# Patient Record
Sex: Female | Born: 1990 | Race: Black or African American | Hispanic: No | Smoking: Current some day smoker
Health system: Southern US, Community
[De-identification: ages and names within clinical notes are randomized; demographics above are authoritative.]

## PROBLEM LIST (undated history)

## (undated) DIAGNOSIS — Z789 Other specified health status: Secondary | ICD-10-CM

## (undated) HISTORY — DX: Other specified health status: Z78.9

## (undated) HISTORY — PX: NO PAST SURGERIES: SHX2092

---
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
## 2010-07-29 ENCOUNTER — Emergency Department (HOSPITAL_COMMUNITY): Admission: EM | Admit: 2010-07-29 | Discharge: 2010-07-29 | Payer: Self-pay | Admitting: Emergency Medicine

## 2010-12-22 ENCOUNTER — Emergency Department (HOSPITAL_COMMUNITY)
Admission: EM | Admit: 2010-12-22 | Discharge: 2010-12-22 | Disposition: A | Discharge status: Home / Self Care | Payer: Self-pay | Attending: Emergency Medicine | Admitting: Emergency Medicine

## 2010-12-22 DIAGNOSIS — R51 Headache: Secondary | ICD-10-CM | POA: Insufficient documentation

## 2010-12-22 DIAGNOSIS — J019 Acute sinusitis, unspecified: Secondary | ICD-10-CM | POA: Insufficient documentation

## 2010-12-31 LAB — URINALYSIS, ROUTINE W REFLEX MICROSCOPIC
Bilirubin Urine: NEGATIVE
Hgb urine dipstick: NEGATIVE
Ketones, ur: NEGATIVE mg/dL
Protein, ur: NEGATIVE mg/dL
Specific Gravity, Urine: 1.02 (ref 1.005–1.030)
Urobilinogen, UA: 1 mg/dL (ref 0.0–1.0)
pH: 6 (ref 5.0–8.0)

## 2010-12-31 LAB — URINE MICROSCOPIC-ADD ON

## 2011-03-06 NOTE — Op Note (Signed)
Crystal Gould, Crystal Gould               ACCOUNT NO.:  [id number]   MEDICAL RECORD NO.:  [id number]          PATIENT TYPE:  INP   LOCATION:  A401                          FACILITY:  [facility]   PHYSICIAN:  Lazaro Arms, M.D.   DATE OF BIRTH:  06-09-1991   DATE OF PROCEDURE:  12/11/2006  DATE OF DISCHARGE:  12/12/2006                               OPERATIVE REPORT   PROCEDURE:  Delivery note.   INDICATIONS FOR PROCEDURE:  Crystal Gould is a 20 year old gravida 1, para 0,  at [redacted] weeks gestation who came in active labor.  She underwent an  amniotomy and had thick meconium in amniotic fluid.  She has progressed  very quickly through the active phase of labor.  She has had a reactive  NST throughout.   The patient had very little maternal expulsive efforts. About three  contractions, she pushed and over an intact perineum delivered a viable  female infant at 1335, Apgars were 9 and 9, weighing 5 pounds 5 ounces.  There was a three vessel cord.  Cord blood and cord gas were sent.  The  infant underwent DeLee suction for quite some time on the perineum and  got about 6 mL of meconium stained amniotic fluid from the stomach and  nasopharynx.  The infant then cried after delivery and so I went ahead  and suctioned it and did quite well and was quite vigorous.  Cord blood  and cord gas were sent.  The placenta was delivered spontaneously and  was intact.  The uterus was firm.  The infant underwent routine neonatal  resuscitation.  There was a small right periurethral laceration and a  perineal laceration, neither of which required repair.  They were both  first degree.  She will undergo routine postpartum course and blood loss  for delivery is 250 mL.      Lazaro Arms, M.D.  Electronically Signed     LHE/MEDQ  D:  12/12/2006  T:  12/12/2006  Job:  [id number]

## 2011-03-06 NOTE — H&P (Signed)
Crystal Gould, Crystal Gould               ACCOUNT NO.:  [id number]   MEDICAL RECORD NO.:  [id number]          PATIENT TYPE:  INP   LOCATION:  A401                          FACILITY:  [facility]   PHYSICIAN:  Lazaro Arms, M.D.   DATE OF BIRTH:  May 01, 1991   DATE OF ADMISSION:  12/11/2006  DATE OF DISCHARGE:  LH                              HISTORY & PHYSICAL   Crystal Gould is a 20 year old African-American female gravida 1, para 0,  estimated date of delivery of December 23, 2006, currently at [redacted] weeks  gestation who presented to Labor & Delivery in spontaneous labor.  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  She came in and she is 4 cm  dilated, in spontaneous labor, reactive NST.   PAST MEDICAL HISTORY:  Negative.   PAST SURGERY:  Negative.   PAST OB:  She is nulliparous.   ALLERGIES:  NONE.   MEDICATIONS:  Prenatal vitamins with iron.   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  GC and Chlamydia:  GC was negative and Chlamydia has been negative 3  times despite supposedly taking treatment and her social partner being  treated.  She is Group B Strep negative, Glucola was 78.   IMPRESSION:  1. Intrauterine pregnancy [redacted] weeks gestation.  2. Active labor.   PLAN:  Patient admitted for labor management.      Lazaro Arms, M.D.  Electronically Signed     LHE/MEDQ  D:  12/12/2006  T:  12/12/2006  Job:  [id number]

## 2011-03-06 NOTE — H&P (Signed)
NAMEHOLLY, IANNACCONE               ACCOUNT NO.:  [id number]   MEDICAL RECORD NO.:  [id number]          PATIENT TYPE:  OIB   LOCATION:  LDR1                          FACILITY:  [facility]   PHYSICIAN:  Tilda Burrow, M.D. DATE OF BIRTH:  [birth year]   DATE OF ADMISSION:  12/06/2006  DATE OF DISCHARGE:  LH                              HISTORY & PHYSICAL   OBSERVATION ADMITTING HISTORY AND PHYSICAL:  Date of admission December 06, 2006, at 4:32 a.m.   HISTORY OF PRESENT ILLNESS:  Novi is a 20 year old gravida 1, para 0,  in with complaints of uterine contractions every 10 minutes.   MEDICAL HISTORY:  Negative.   SURGICAL HISTORY:  Negative.   FAMILY HISTORY:  Positive for hypertension and cancer.   Prenatal course uneventful with the exception that she has chronic  Chlamydia, she is noncompliant with her medication.  Apparently when she  takes it she throws it right back up, so when she gets in labor we will  treat her with IV azithromycin also.  She was seen in the office last  week and noted to have a cellulitis of the right breast, for which she  was placed on Keflex and states she is taking that at present time.   PHYSICAL EXAMINATION:  VITAL SIGNS:  Stable.  PELVIC:  Cervix is about 2 cm, 70% effaced, -1 to -2 station, which is  unchanged since her admit exam.   PLAN:  We are going to discharge her home with therapeutic rest, give  her Rocephin 1 g IM for a UTI and the cellulitis, and she is to follow  up this week in the office or p.r.n. as needed.      Zerita Boers, Lanier Clam      Tilda Burrow, M.D.  Electronically Signed    DL/MEDQ  D:  21/30/8657  T:  12/06/2006  Job:  [id number]   cc:   [facility]

## 2011-03-06 NOTE — H&P (Signed)
Crystal Gould, Crystal Gould               ACCOUNT NO.:  [id number]   MEDICAL RECORD NO.:  [id number]          PATIENT TYPE:  OIB   LOCATION:  LDR4                          FACILITY:  [facility]   PHYSICIAN:  Tilda Burrow, M.D. DATE OF BIRTH:  1991/06/16   DATE OF ADMISSION:  12/08/2006  DATE OF DISCHARGE:  LH                              HISTORY & PHYSICAL   REASON FOR VISIT:  Prenatal care and she has a right breast abscess that  is unresponsive to p.o. antibiotics.   MEDICAL HISTORY:  Negative.   SURGICAL HISTORY:  Negative.   FAMILY HISTORY:  Positive for hypertension and cancer.   PRENATAL COURSE:  Uneventful with the exception she has had several  positive Chlamydias and HSV-2.  Blood type is O positive.  UDS negative.  Rubella is immune.  Hepatitis B surface antigen is negative.  HIV is  negative.  HSV-2 biopsy of Pap normal.  Serology nonreactive.  GC has  been negative on first draw; second culture she is positive with both GC  and Chlamydia and she is unable to keep down her p.o. medications for  the treatment so we are going to treat her with IV antibiotics.  AFP  normal.  A 28-week hemoglobin is 12.2.  A 28-week hematocrit 31.  One-  hour glucose is 78.   PHYSICAL EXAMINATION:  On physical exam today, weight is 126, blood  pressure 120/80.  There is 1+ leukocytes.  Fundal height is 36 cm.  Fetal heart is 130, strong and regular.  On her right breast, the  abscess is  significantly larger.  When I first saw her last week, I thought it was  a mild cellulitis.  I put her on Keflex.  I also saw her in the hospital  and gave her some IM Rocephin and it is unresponsive and now there is a  definite abscess present.  I consulted with Dr. Emelda Fear. We are going  to get Dr. Lovell Sheehan to evaluate the patient for possible I&D.      Zerita Boers, Lanier Clam      Tilda Burrow, M.D.  Electronically Signed    DL/MEDQ  D:  16/07/9603  T:  12/08/2006  Job:  [id number]   cc:   Dalia Heading, M.D.  Fax: [phone]   [facility]

## 2011-03-06 NOTE — Op Note (Signed)
NAMEATALYA, DANO               ACCOUNT NO.:  [id number]   MEDICAL RECORD NO.:  [id number]          PATIENT TYPE:  OIB   LOCATION:  LDR4                          FACILITY:  [facility]   PHYSICIAN:  Dalia Heading, M.D.  DATE OF BIRTH:  June 22, 1991   DATE OF PROCEDURE:  12/08/2006  DATE OF DISCHARGE:                               OPERATIVE REPORT   PREOPERATIVE DIAGNOSIS:  Abscess, right breast.   POSTOPERATIVE DIAGNOSIS:  Abscess, right breast.   PROCEDURE:  Incision and drainage of right breast abscess.   SURGEON:  Dr. Franky Macho   ANESTHESIA:  Local   INDICATIONS:  The patient is a 20 year old 8-1/2 month pregnant black  female who presents with a worsening abscess along the inferior aspect  of the areolar complex in the right breast.  The patient comes to the  minor procedure room for incision and drainage of the abscess.  Risks  and benefits of procedure were fully explained to the patient's mother,  who gave informed consent for the patient as the patient is a minor.   PROCEDURE NOTE:  The patient was placed in supine position.  Surgical  site confirmation was performed.  The right breast was prepped and  draped in the usual sterile technique with Betadine.  5 mL of 1%  Xylocaine was used local anesthesia.   An incision was made into the fluctuant an area along the infra-areolar  border.  Purulent fluid was obtained.  Cultures were sent to  microbiology for further examination and treatment.  The wound was  irrigated with saline.  Skin was then packed with iodoform Nugauze.  Dry  sterile dressing was then applied.   All tape and needle counts correct at end of the procedure.  The patient  tolerated procedure well and was transferred back to the 4th floor in  stable condition.   COMPLICATIONS:  None.   SPECIMEN:  Routine culture of right breast abscess.   BLOOD LOSS:  Minimal.      Dalia Heading, M.D.  Electronically Signed     MAJ/MEDQ  D:  12/08/2006  T:   12/08/2006  Job:  [id number]   cc:   Tilda Burrow, M.D.  Fax: [phone]

## 2011-07-09 LAB — URINALYSIS, ROUTINE W REFLEX MICROSCOPIC
Glucose, UA: NEGATIVE
Nitrite: NEGATIVE
Specific Gravity, Urine: <1.005 — ABNORMAL LOW
Urobilinogen, UA: 0.2
pH: 7

## 2011-07-09 LAB — URINE MICROSCOPIC-ADD ON

## 2011-07-10 LAB — CBC
HCT: 24.8 — ABNORMAL LOW
Hemoglobin: 8.7 — ABNORMAL LOW
MCV: 73.2 — ABNORMAL LOW
MCV: 73.3 — ABNORMAL LOW
Platelets: 188
RBC: 3.39 — ABNORMAL LOW
RBC: 3.5 — ABNORMAL LOW
RDW: 14.4

## 2011-07-10 LAB — RPR: RPR Ser Ql: NONREACTIVE

## 2011-08-05 ENCOUNTER — Emergency Department (HOSPITAL_COMMUNITY)
Admission: EM | Admit: 2011-08-05 | Discharge: 2011-08-05 | Disposition: A | Discharge status: Home / Self Care | Payer: Medicaid Other | Attending: Emergency Medicine | Admitting: Emergency Medicine

## 2011-08-05 DIAGNOSIS — B9689 Other specified bacterial agents as the cause of diseases classified elsewhere: Secondary | ICD-10-CM | POA: Insufficient documentation

## 2011-08-05 DIAGNOSIS — A499 Bacterial infection, unspecified: Secondary | ICD-10-CM | POA: Insufficient documentation

## 2011-08-05 DIAGNOSIS — A749 Chlamydial infection, unspecified: Secondary | ICD-10-CM | POA: Insufficient documentation

## 2011-08-05 DIAGNOSIS — N760 Acute vaginitis: Secondary | ICD-10-CM

## 2011-08-05 DIAGNOSIS — Z331 Pregnant state, incidental: Secondary | ICD-10-CM | POA: Insufficient documentation

## 2011-08-05 LAB — DIFFERENTIAL
Basophils Absolute: 0 10*3/uL (ref 0.0–0.1)
Lymphocytes Relative: 19 % (ref 12–46)
Neutro Abs: 6.3 10*3/uL (ref 1.7–7.7)
Neutrophils Relative %: 75 % (ref 43–77)

## 2011-08-05 LAB — CBC
MCHC: 34.2 g/dL (ref 30.0–36.0)
MCV: 79.5 fL (ref 78.0–100.0)
Platelets: 239 10*3/uL (ref 150–400)
RBC: 4.05 MIL/uL (ref 3.87–5.11)
RDW: 12.8 % (ref 11.5–15.5)

## 2011-08-05 LAB — RPR: RPR Ser Ql: NONREACTIVE

## 2011-08-05 LAB — URINALYSIS, ROUTINE W REFLEX MICROSCOPIC
Glucose, UA: NEGATIVE mg/dL
Leukocytes, UA: NEGATIVE
Nitrite: NEGATIVE
Specific Gravity, Urine: 1.025 (ref 1.005–1.030)
pH: 7 (ref 5.0–8.0)

## 2011-08-05 LAB — BASIC METABOLIC PANEL
BUN: 7 mg/dL (ref 6–23)
Chloride: 103 mEq/L (ref 96–112)
Creatinine, Ser: 0.55 mg/dL (ref 0.50–1.10)
GFR calc Af Amer: >90 mL/min (ref >90)

## 2011-08-05 LAB — PREGNANCY, URINE: Preg Test, Ur: POSITIVE

## 2011-08-05 LAB — URINE MICROSCOPIC-ADD ON

## 2011-08-05 LAB — WET PREP, GENITAL

## 2011-08-05 MED ORDER — METRONIDAZOLE 0.75 % VA GEL: 1.0000 | Freq: Two times a day (BID) | VAGINAL | Status: AC

## 2011-08-05 NOTE — ED Notes (Signed)
Pt reports is approx 2months pregnant.  Pt c/o lower abd pain x 1 week.  Reports weakness and fever over the weekend.  Says fever and weakness are better but started spotting yesterday.  Still having abd pain.  LMP was July.

## 2011-08-05 NOTE — ED Provider Notes (Signed)
History     CSN: [id number] Arrival date & time: 08/05/2011  8:29 AM   First MD Initiated Contact with Patient 08/05/11 [phone]      Chief Complaint  Patient presents with  . Abdominal Pain    (Consider location/radiation/quality/duration/timing/severity/associated sxs/prior treatment) HPI Comments: Pt reports being 2 months pregnant  Patient is a 20 y.o. female presenting with abdominal pain. The history is provided by the patient.  Abdominal Pain The primary symptoms of the illness include abdominal pain and vaginal bleeding. The primary symptoms of the illness do not include shortness of breath or dysuria. The current episode started more than 2 days ago. The onset of the illness was gradual. The problem has been gradually worsening.  The patient states that she believes she is currently pregnant. The patient has not had a change in bowel habit. Additional symptoms associated with the illness include chills. Symptoms associated with the illness do not include heartburn, constipation, urgency, hematuria, frequency or back pain. Significant associated medical issues do not include PUD, GERD, inflammatory bowel disease, diabetes, sickle cell disease, gallstones, liver disease, substance abuse, diverticulitis, HIV or cardiac disease.    History reviewed. No pertinent past medical history.  History reviewed. No pertinent past surgical history.  History reviewed. No pertinent family history.  History  Substance Use Topics  . Smoking status: Former Games developer  . Smokeless tobacco: Not on file  . Alcohol Use: No    OB History    Grav Para Term Preterm Abortions TAB SAB Ect Mult Living   1               Review of Systems  Constitutional: Positive for chills. Negative for activity change.       All ROS Neg except as noted in HPI  HENT: Negative for nosebleeds and neck pain.   Eyes: Negative for photophobia and discharge.  Respiratory: Negative for cough, shortness of breath and  wheezing.   Cardiovascular: Negative for chest pain and palpitations.  Gastrointestinal: Positive for abdominal pain. Negative for heartburn, constipation and blood in stool.  Genitourinary: Positive for vaginal bleeding. Negative for dysuria, urgency, frequency and hematuria.  Musculoskeletal: Negative for back pain and arthralgias.  Skin: Negative.   Neurological: Negative for dizziness, seizures and speech difficulty.  Psychiatric/Behavioral: Negative for hallucinations and confusion.    Allergies  Review of patient's allergies indicates no known allergies.  Home Medications  No current outpatient prescriptions on file.  BP 116/67  Pulse 86  Temp(Src) 98.9 F (37.2 C) (Oral)  Resp 17  Ht 5\' 1"  (1.549 m)  Wt 123 lb (55.792 kg)  BMI 23.24 kg/m2  SpO2 100%  LMP 05/13/2011  Physical Exam  Nursing note and vitals reviewed. Constitutional: She is oriented to person, place, and time. She appears well-developed and well-nourished.  Non-toxic appearance.  HENT:  Head: Normocephalic.  Right Ear: Tympanic membrane and external ear normal.  Left Ear: Tympanic membrane and external ear normal.  Eyes: EOM and lids are normal. Pupils are equal, round, and reactive to light.  Neck: Normal range of motion. Neck supple. Carotid bruit is not present.  Cardiovascular: Normal rate, regular rhythm, normal heart sounds, intact distal pulses and normal pulses.   Pulmonary/Chest: Breath sounds normal. No respiratory distress.  Abdominal: Soft. Bowel sounds are normal. There is tenderness. There is no guarding.       Pt has diffuse mild  tenderness of the abd.. Lower abd more than upper abd. Bowel sound active.  Genitourinary:  The external vaginal area is wnl. The os is closed. Thick white discharge present with small amount of blood in the vaginal vault. Cervix is friable. Mild cervical tenderness. Mild adnexal tenderness. No mass.  Musculoskeletal: Normal range of motion.    Lymphadenopathy:       Head (right side): No submandibular adenopathy present.       Head (left side): No submandibular adenopathy present.    She has no cervical adenopathy.  Neurological: She is alert and oriented to person, place, and time. She has normal strength. No cranial nerve deficit or sensory deficit.  Skin: Skin is warm and dry.  Psychiatric: She has a normal mood and affect. Her speech is normal.    ED Course  Procedures (including critical care time)  Labs Reviewed - No data to display No results found.   No diagnosis found.    MDM  I have reviewed nursing notes, vital signs, and all appropriate lab and imaging results for this patient.  Results for orders placed during the hospital encounter of 08/05/11  CBC      Component Value Range   WBC 8.4  4.0 - 10.5 (K/uL)   RBC 4.05  3.87 - 5.11 (MIL/uL)   Hemoglobin 11.0 (*) 12.0 - 15.0 (g/dL)   HCT 40.9 (*) 81.1 - 46.0 (%)   MCV 79.5  78.0 - 100.0 (fL)   MCH 27.2  26.0 - 34.0 (pg)   MCHC 34.2  30.0 - 36.0 (g/dL)   RDW 91.4  78.2 - 95.6 (%)   Platelets 239  150 - 400 (K/uL)  DIFFERENTIAL      Component Value Range   Neutrophils Relative 75  43 - 77 (%)   Neutro Abs 6.3  1.7 - 7.7 (K/uL)   Lymphocytes Relative 19  12 - 46 (%)   Lymphs Abs 1.6  0.7 - 4.0 (K/uL)   Monocytes Relative 5  3 - 12 (%)   Monocytes Absolute 0.4  0.1 - 1.0 (K/uL)   Eosinophils Relative 1  0 - 5 (%)   Eosinophils Absolute 0.1  0.0 - 0.7 (K/uL)   Basophils Relative 0  0 - 1 (%)   Basophils Absolute 0.0  0.0 - 0.1 (K/uL)  BASIC METABOLIC PANEL      Component Value Range   Sodium 137  135 - 145 (mEq/L)   Potassium 3.8  3.5 - 5.1 (mEq/L)   Chloride 103  96 - 112 (mEq/L)   CO2 25  19 - 32 (mEq/L)   Glucose, Bld 84  70 - 99 (mg/dL)   BUN 7  6 - 23 (mg/dL)   Creatinine, Ser 2.13  0.50 - 1.10 (mg/dL)   Calcium 9.4  8.4 - 08.6 (mg/dL)   GFR calc non Af Amer >90  >90 (mL/min)   GFR calc Af Amer >90  >90 (mL/min)  URINALYSIS, ROUTINE W  REFLEX MICROSCOPIC      Component Value Range   Color, Urine YELLOW  YELLOW    Appearance CLEAR  CLEAR    Specific Gravity, Urine 1.025  1.005 - 1.030    pH 7.0  5.0 - 8.0    Glucose, UA NEGATIVE  NEGATIVE (mg/dL)   Hgb urine dipstick SMALL (*) NEGATIVE    Bilirubin Urine NEGATIVE  NEGATIVE    Ketones, ur NEGATIVE  NEGATIVE (mg/dL)   Protein, ur NEGATIVE  NEGATIVE (mg/dL)   Urobilinogen, UA 1.0  0.0 - 1.0 (mg/dL)   Nitrite NEGATIVE  NEGATIVE    Leukocytes,  UA NEGATIVE  NEGATIVE   PREGNANCY, URINE      Component Value Range   Preg Test, Ur POSITIVE    HCG, QUANTITATIVE, PREGNANCY      Component Value Range   hCG, Beta Chain, Quant, S [id number] (*) <5 (mIU/mL)  GC/CHLAMYDIA PROBE AMP, GENITAL      Component Value Range   GC Probe Amp, Genital NEGATIVE  NEGATIVE    Chlamydia, DNA Probe POSITIVE (*) NEGATIVE   RPR      Component Value Range   RPR NON REACTIVE  NON REACTIVE   WET PREP, GENITAL      Component Value Range   Yeast, Wet Prep NONE SEEN  NONE SEEN    Trich, Wet Prep NONE SEEN  NONE SEEN    Clue Cells, Wet Prep MANY (*) NONE SEEN    WBC, Wet Prep HPF POC TOO NUMEROUS TO COUNT (*) NONE SEEN   URINE MICROSCOPIC-ADD ON      Component Value Range   Squamous Epithelial / LPF MANY (*) RARE    WBC, UA 0-2  <3 (WBC/hpf)   RBC / HPF 0-2  <3 (RBC/hpf)   Bacteria, UA FEW (*) RARE    No results found.       Kathie Dike, [place] 08/20/11 2122

## 2011-08-07 LAB — GC/CHLAMYDIA PROBE AMP, GENITAL
Chlamydia, DNA Probe: POSITIVE — AB
GC Probe Amp, Genital: NEGATIVE

## 2011-08-08 NOTE — ED Notes (Signed)
+   chlamydia Chart sent to EDP office for review. 

## 2011-08-11 NOTE — ED Provider Notes (Signed)
Medical screening examination/treatment/procedure(s) were performed by non-physician practitioner and as supervising physician I was immediately available for consultation/collaboration.  Nicholes Stairs, MD 08/11/11 [phone]

## 2011-08-21 NOTE — ED Provider Notes (Signed)
Medical screening examination/treatment/procedure(s) were performed by non-physician practitioner and as supervising physician I was immediately available for consultation/collaboration.  Elhadj Girton P Antoninette Lerner, MD 08/21/11 0729 

## 2012-07-31 ENCOUNTER — Encounter (HOSPITAL_COMMUNITY): Payer: Self-pay | Admitting: Emergency Medicine

## 2012-07-31 ENCOUNTER — Emergency Department (HOSPITAL_COMMUNITY)
Admission: EM | Admit: 2012-07-31 | Discharge: 2012-07-31 | Disposition: A | Discharge status: Home / Self Care | Payer: Medicaid Other | Attending: Emergency Medicine | Admitting: Emergency Medicine

## 2012-07-31 DIAGNOSIS — R51 Headache: Secondary | ICD-10-CM | POA: Insufficient documentation

## 2012-07-31 DIAGNOSIS — Z87891 Personal history of nicotine dependence: Secondary | ICD-10-CM | POA: Insufficient documentation

## 2012-07-31 DIAGNOSIS — J029 Acute pharyngitis, unspecified: Secondary | ICD-10-CM | POA: Insufficient documentation

## 2012-07-31 NOTE — ED Provider Notes (Signed)
History     CSN: [id number]  Arrival date & time 07/31/12  4098   First MD Initiated Contact with Patient 07/31/12 [phone]      Chief Complaint  Patient presents with  . Headache  . Sore Throat    (Consider location/radiation/quality/duration/timing/severity/associated sxs/prior treatment) HPI Comments: Took a tylenol yest.    No n/v/d.  No PCP.  Patient is a 21 y.o. female presenting with headaches and pharyngitis. The history is provided by the patient. No language interpreter was used.  Headache  This is a new problem. Episode onset: 5 days ago. The problem occurs constantly. The problem has not changed since onset.Associated with: sore throat, subj fever, chills and myalgias. Pain location: diffuse. The quality of the pain is described as throbbing. The pain is moderate. The pain does not radiate. Associated symptoms include a fever. Pertinent negatives include no near-syncope, no shortness of breath, no nausea and no vomiting.  Sore Throat Associated symptoms include chills, a fever, headaches, myalgias and a sore throat. Pertinent negatives include no coughing, nausea or vomiting.    History reviewed. No pertinent past medical history.  History reviewed. No pertinent past surgical history.  No family history on file.  History  Substance Use Topics  . Smoking status: Former Games developer  . Smokeless tobacco: Not on file  . Alcohol Use: No    OB History    Grav Para Term Preterm Abortions TAB SAB Ect Mult Living   1               Review of Systems  Constitutional: Positive for fever and chills.  HENT: Positive for sore throat.   Respiratory: Negative for cough and shortness of breath.   Cardiovascular: Negative for near-syncope.  Gastrointestinal: Negative for nausea and vomiting.  Musculoskeletal: Positive for myalgias.  Neurological: Positive for headaches.  All other systems reviewed and are negative.    Allergies  Review of patient's allergies indicates no  known allergies.  Home Medications  No current outpatient prescriptions on file.  BP 142/91  Pulse 91  Temp 98.3 F (36.8 C) (Oral)  Resp 16  Ht 5\' 1"  (1.549 m)  Wt 110 lb (49.896 kg)  BMI 20.78 kg/m2  SpO2 100%  LMP 07/31/2012  Breastfeeding? Unknown  Physical Exam  Nursing note and vitals reviewed. Constitutional: She is oriented to person, place, and time. She appears well-developed and well-nourished. No distress.  HENT:  Head: Normocephalic and atraumatic.  Mouth/Throat: Uvula is midline and mucous membranes are normal. No uvula swelling. Posterior oropharyngeal erythema present. No oropharyngeal exudate, posterior oropharyngeal edema or tonsillar abscesses.  Eyes: EOM are normal.  Neck: Normal range of motion.  Cardiovascular: Normal rate, regular rhythm and normal heart sounds.   Pulmonary/Chest: Effort normal and breath sounds normal.  Abdominal: Soft. She exhibits no distension. There is no tenderness.  Musculoskeletal: Normal range of motion.  Lymphadenopathy:       Right cervical: No superficial cervical and no deep cervical adenopathy present.      Left cervical: No superficial cervical and no deep cervical adenopathy present.  Neurological: She is alert and oriented to person, place, and time.  Skin: Skin is warm and dry.  Psychiatric: She has a normal mood and affect. Judgment normal.    ED Course  Procedures (including critical care time)   Labs Reviewed  RAPID STREP SCREEN   No results found.   1. Pharyngitis       MDM  Salt water gargles , chloraseptic, tylenol  or ibuprofen.   Find a PCP        Evalina Field, PA 07/31/12 1229

## 2012-07-31 NOTE — ED Notes (Signed)
Vitals done at 1056 are on wrong patient

## 2012-07-31 NOTE — ED Notes (Signed)
Pt c/o ha/sore throat/chills x 5 days.

## 2012-08-02 NOTE — ED Provider Notes (Signed)
Medical screening examination/treatment/procedure(s) were performed by non-physician practitioner and as supervising physician I was immediately available for consultation/collaboration.   Laray Anger, DO 08/02/12 2100

## 2013-04-17 ENCOUNTER — Encounter (HOSPITAL_COMMUNITY): Payer: Self-pay | Admitting: *Deleted

## 2013-04-17 ENCOUNTER — Emergency Department (HOSPITAL_COMMUNITY)
Admission: EM | Admit: 2013-04-17 | Discharge: 2013-04-17 | Disposition: A | Discharge status: Home / Self Care | Payer: Medicaid Other | Attending: Emergency Medicine | Admitting: Emergency Medicine

## 2013-04-17 DIAGNOSIS — F17200 Nicotine dependence, unspecified, uncomplicated: Secondary | ICD-10-CM | POA: Insufficient documentation

## 2013-04-17 DIAGNOSIS — N949 Unspecified condition associated with female genital organs and menstrual cycle: Secondary | ICD-10-CM | POA: Insufficient documentation

## 2013-04-17 DIAGNOSIS — A499 Bacterial infection, unspecified: Secondary | ICD-10-CM | POA: Insufficient documentation

## 2013-04-17 DIAGNOSIS — Z3202 Encounter for pregnancy test, result negative: Secondary | ICD-10-CM | POA: Insufficient documentation

## 2013-04-17 DIAGNOSIS — B9689 Other specified bacterial agents as the cause of diseases classified elsewhere: Secondary | ICD-10-CM

## 2013-04-17 DIAGNOSIS — N898 Other specified noninflammatory disorders of vagina: Secondary | ICD-10-CM | POA: Insufficient documentation

## 2013-04-17 DIAGNOSIS — N390 Urinary tract infection, site not specified: Secondary | ICD-10-CM

## 2013-04-17 DIAGNOSIS — N760 Acute vaginitis: Secondary | ICD-10-CM | POA: Insufficient documentation

## 2013-04-17 LAB — URINALYSIS, ROUTINE W REFLEX MICROSCOPIC
Glucose, UA: NEGATIVE mg/dL
Hgb urine dipstick: NEGATIVE
Ketones, ur: NEGATIVE mg/dL
Protein, ur: NEGATIVE mg/dL
Urobilinogen, UA: 0.2 mg/dL (ref 0.0–1.0)

## 2013-04-17 LAB — CBC WITH DIFFERENTIAL/PLATELET
Basophils Relative: 0 % (ref 0–1)
Eosinophils Absolute: 0.1 10*3/uL (ref 0.0–0.7)
Eosinophils Relative: 2 % (ref 0–5)
HCT: 40.9 % (ref 36.0–46.0)
Hemoglobin: 13.3 g/dL (ref 12.0–15.0)
MCH: 27.2 pg (ref 26.0–34.0)
MCHC: 32.5 g/dL (ref 30.0–36.0)
Monocytes Absolute: 0.3 10*3/uL (ref 0.1–1.0)
Monocytes Relative: 4 % (ref 3–12)
RDW: 13.3 % (ref 11.5–15.5)

## 2013-04-17 LAB — BASIC METABOLIC PANEL
BUN: 13 mg/dL (ref 6–23)
Calcium: 9.8 mg/dL (ref 8.4–10.5)
Chloride: 99 mEq/L (ref 96–112)
Creatinine, Ser: 0.74 mg/dL (ref 0.50–1.10)
GFR calc Af Amer: >90 mL/min (ref >90)
GFR calc non Af Amer: >90 mL/min (ref >90)

## 2013-04-17 LAB — URINE MICROSCOPIC-ADD ON

## 2013-04-17 LAB — WET PREP, GENITAL

## 2013-04-17 MED ORDER — CEPHALEXIN 500 MG PO CAPS: 500.0000 mg | ORAL_CAPSULE | Freq: Four times a day (QID) | ORAL | Status: DC

## 2013-04-17 MED ORDER — METRONIDAZOLE 500 MG PO TABS: 500.0000 mg | ORAL_TABLET | Freq: Two times a day (BID) | ORAL | Status: DC

## 2013-04-17 NOTE — ED Provider Notes (Signed)
History    CSN: [id number] Arrival date & time 04/17/13  0829  First MD Initiated Contact with Patient 04/17/13 0845     Chief Complaint  Patient presents with  . Vaginal Bleeding   (Consider location/radiation/quality/duration/timing/severity/associated sxs/prior Treatment) Patient is a 22 y.o. female presenting with vaginal bleeding. The history is provided by the patient.  Vaginal Bleeding Quality:  Spotting Severity:  Mild Onset quality:  Sudden Duration:  2 days Timing:  Constant Progression:  Partially resolved Chronicity:  New Menstrual history:  Regular Number of pads used:  One per day Possible pregnancy: unknown.   Context: spontaneously   Relieved by:  Nothing Worsened by:  Nothing tried Ineffective treatments:  None tried Associated symptoms: vaginal discharge   Associated symptoms: no abdominal pain, no back pain, no dizziness, no dysuria, no fatigue, no fever and no nausea   Vaginal discharge:    Quality:  White and thin   Severity:  Mild   Onset quality:  Gradual   Timing:  Constant   Progression:  Unchanged   Chronicity:  New Risk factors: unprotected sex   Risk factors: no hx of ectopic pregnancy, no gynecological surgery, does not have multiple partners, no PID and no terminated pregnancies    History reviewed. No pertinent past medical history. History reviewed. No pertinent past surgical history. No family history on file. History  Substance Use Topics  . Smoking status: Current Every Day Smoker    Types: Cigarettes  . Smokeless tobacco: Not on file  . Alcohol Use: Yes     Comment: sometimes   OB History   Grav Para Term Preterm Abortions TAB SAB Ect Mult Living   1              Review of Systems  Constitutional: Negative for fever, chills, appetite change and fatigue.  Respiratory: Negative for shortness of breath.   Cardiovascular: Negative for chest pain.  Gastrointestinal: Negative for nausea, vomiting, abdominal pain, blood in  stool and abdominal distention.  Genitourinary: Positive for vaginal bleeding, vaginal discharge, vaginal pain and menstrual problem. Negative for dysuria, urgency, flank pain, decreased urine volume, difficulty urinating and genital sores.  Musculoskeletal: Negative for back pain.  Skin: Negative for color change and rash.  Neurological: Negative for dizziness, weakness and numbness.  Hematological: Negative for adenopathy.  All other systems reviewed and are negative.    Allergies  Review of patient's allergies indicates no known allergies.  Home Medications  No current outpatient prescriptions on file. BP 118/72  Pulse 104  Temp(Src) 98.5 F (36.9 C) (Oral)  Resp 16  SpO2 100%  LMP 03/20/2013 Physical Exam  Nursing note and vitals reviewed. Constitutional: She is oriented to person, place, and time. She appears well-developed and well-nourished. No distress.  HENT:  Head: Normocephalic and atraumatic.  Mouth/Throat: Oropharynx is clear and moist.  Cardiovascular: Normal rate, regular rhythm, normal heart sounds and intact distal pulses.   No murmur heard. Pulmonary/Chest: Effort normal and breath sounds normal. No respiratory distress.  Abdominal: Soft. Bowel sounds are normal. She exhibits no distension and no mass. There is no tenderness. There is no rebound and no guarding.  Genitourinary: Uterus normal. There is no tenderness or lesion on the right labia. There is no tenderness or lesion on the left labia. Cervix exhibits discharge. Cervix exhibits no motion tenderness and no friability. Right adnexum displays no mass and no tenderness. Left adnexum displays no mass and no tenderness. No erythema, tenderness or bleeding around the vagina.  No foreign body around the vagina. No signs of injury around the vagina. Vaginal discharge found.  Thin, white vaginal d/c present, no CMT, no adnexal tenderness or masses  Musculoskeletal: Normal range of motion. She exhibits no edema.   Neurological: She is alert and oriented to person, place, and time.  Skin: Skin is warm and dry.    ED Course  Procedures (including critical care time) Labs Reviewed  WET PREP, GENITAL - Abnormal; Notable for the following:    Clue Cells Wet Prep HPF POC MANY (*)    WBC, Wet Prep HPF POC MANY (*)    All other components within normal limits  URINALYSIS, ROUTINE W REFLEX MICROSCOPIC - Abnormal; Notable for the following:    Leukocytes, UA MODERATE (*)    All other components within normal limits  BASIC METABOLIC PANEL - Abnormal; Notable for the following:    Glucose, Bld 103 (*)    All other components within normal limits  URINE MICROSCOPIC-ADD ON - Abnormal; Notable for the following:    Squamous Epithelial / LPF FEW (*)    Bacteria, UA MANY (*)    All other components within normal limits  GC/CHLAMYDIA PROBE AMP  URINE CULTURE  PREGNANCY, URINE  CBC WITH DIFFERENTIAL     MDM     Urine, GC and chlamydia cultures are pending.     VSS.  Patient is well appearing and stable for discharge. No abdominal tenderness or CMT on exam.   Clinical suspicion for TOA or PID is low.  Will treat for BV ,UTI and pt agrees to close f/u with GYN for recheck or to return here if sx's worsen.  Advised patient that she will be informed of pending GC and Chlaymdia results.    Maniyah Moller L. Trisha Mangle, PA-C 04/18/13 2201

## 2013-04-17 NOTE — ED Notes (Signed)
Last menstrual cycle began 1st week of this month.  Had 2 days of flow, followed by 2 days of cessation, then flow began again and has not stopped for remainder of month.  Having pelvic cramping pain, some discharge and odor.

## 2013-04-17 NOTE — ED Notes (Signed)
LNP x 1 month ago lasting approx 1 wk, which is normal for pt.  Reports stopped for 2 days and started bleeding again and has not stopped.  Also reporting vaginal odor and lower abd pain.

## 2013-04-18 LAB — URINE CULTURE

## 2013-04-19 NOTE — ED Provider Notes (Signed)
Medical screening examination/treatment/procedure(s) were performed by non-physician practitioner and as supervising physician I was immediately available for consultation/collaboration. Devoria Albe, MD, Armando Gang   Ward Givens, MD 04/19/13 [phone]

## 2013-04-20 NOTE — ED Notes (Signed)
+  Gonorrhea. Chart sent to EDP office for review. 

## 2013-04-27 NOTE — ED Notes (Addendum)
Chart returned from EDP office. Patient should return to ED for treatment 250 mg Rocephin Im once f/u for further testing at local [facility] per Arthor Captain.

## 2013-04-28 ENCOUNTER — Telehealth (HOSPITAL_COMMUNITY): Payer: Self-pay | Admitting: *Deleted

## 2013-04-30 ENCOUNTER — Telehealth (HOSPITAL_COMMUNITY): Payer: Self-pay | Admitting: Emergency Medicine

## 2014-06-30 ENCOUNTER — Encounter (HOSPITAL_COMMUNITY): Payer: Self-pay | Admitting: Emergency Medicine

## 2014-06-30 ENCOUNTER — Emergency Department (HOSPITAL_COMMUNITY)
Admission: EM | Admit: 2014-06-30 | Discharge: 2014-06-30 | Disposition: A | Discharge status: Home / Self Care | Payer: Medicaid Other | Attending: Emergency Medicine | Admitting: Emergency Medicine

## 2014-06-30 ENCOUNTER — Emergency Department (HOSPITAL_COMMUNITY): Payer: Medicaid Other

## 2014-06-30 DIAGNOSIS — R519 Headache, unspecified: Secondary | ICD-10-CM

## 2014-06-30 DIAGNOSIS — R51 Headache: Secondary | ICD-10-CM | POA: Diagnosis present

## 2014-06-30 DIAGNOSIS — F17200 Nicotine dependence, unspecified, uncomplicated: Secondary | ICD-10-CM | POA: Diagnosis not present

## 2014-06-30 DIAGNOSIS — R52 Pain, unspecified: Secondary | ICD-10-CM | POA: Insufficient documentation

## 2014-06-30 MED ORDER — KETOROLAC TROMETHAMINE 60 MG/2ML IM SOLN
60.0000 mg | Freq: Once | INTRAMUSCULAR | Status: AC
  Administered 2014-06-30: 60 mg via INTRAMUSCULAR
  Filled 2014-06-30: qty 2

## 2014-06-30 MED ORDER — DIPHENHYDRAMINE HCL 50 MG/ML IJ SOLN
25.0000 mg | Freq: Once | INTRAMUSCULAR | Status: AC
  Administered 2014-06-30: 25 mg via INTRAMUSCULAR
  Filled 2014-06-30: qty 1

## 2014-06-30 MED ORDER — METOCLOPRAMIDE HCL 5 MG/ML IJ SOLN
10.0000 mg | Freq: Once | INTRAMUSCULAR | Status: AC
  Administered 2014-06-30: 10 mg via INTRAMUSCULAR
  Filled 2014-06-30: qty 2

## 2014-06-30 MED ORDER — TRAMADOL HCL 50 MG PO TABS: 50.0000 mg | ORAL_TABLET | Freq: Four times a day (QID) | ORAL | Status: DC | PRN

## 2014-06-30 NOTE — ED Provider Notes (Signed)
CSN: [id number]     Arrival date & time 06/30/14  1936 History   First MD Initiated Contact with Patient 06/30/14 1948     Chief Complaint  Patient presents with  . Migraine     (Consider location/radiation/quality/duration/timing/severity/associated sxs/prior Treatment) HPI Comments: Patient presents to the ER for evaluation of headache. Patient reports onset of headache across the frontal region, more on the left side yesterday. Patient reports that it was present through the day, gradually worsened. She took Tylenol and Benadryl for it, but when she woke up this morning it was still there. She has mild nausea, no vomiting, no fever. There is no neck pain or stiffness. This did not injury.  Patient is a 23 y.o. female presenting with migraines.  Migraine Associated symptoms include headaches.    History reviewed. No pertinent past medical history. History reviewed. No pertinent past surgical history. No family history on file. History  Substance Use Topics  . Smoking status: Current Every Day Smoker    Types: Cigarettes  . Smokeless tobacco: Not on file  . Alcohol Use: Yes     Comment: sometimes   OB History   Grav Para Term Preterm Abortions TAB SAB Ect Mult Living   1              Review of Systems  Constitutional: Negative for fever.  Musculoskeletal: Negative for neck pain.  Neurological: Positive for headaches.  All other systems reviewed and are negative.     Allergies  Review of patient's allergies indicates no known allergies.  Home Medications   Prior to Admission medications   Not on File   BP 121/79  Pulse 80  Temp(Src) 99.4 F (37.4 C) (Oral)  Resp 18  Ht  (1.575 m)  Wt 125 lb (56.7 kg)  BMI 22.86 kg/m2  SpO2 100%  LMP 06/02/2014 Physical Exam  Constitutional: She is oriented to person, place, and time. She appears well-developed and well-nourished. No distress.  HENT:  Head: Normocephalic and atraumatic.  Right Ear: Hearing normal.   Left Ear: Hearing normal.  Nose: Nose normal.  Mouth/Throat: Oropharynx is clear and moist and mucous membranes are normal.  Eyes: Conjunctivae and EOM are normal. Pupils are equal, round, and reactive to light.  Neck: Normal range of motion and full passive range of motion without pain. Neck supple. No rigidity. No Brudzinski's sign and no Kernig's sign noted.  Cardiovascular: Regular rhythm, S1 normal and S2 normal.  Exam reveals no gallop and no friction rub.   No murmur heard. Pulmonary/Chest: Effort normal and breath sounds normal. No respiratory distress. She exhibits no tenderness.  Abdominal: Soft. Normal appearance and bowel sounds are normal. There is no hepatosplenomegaly. There is no tenderness. There is no rebound, no guarding, no tenderness at McBurney's point and negative Murphy's sign. No hernia.  Musculoskeletal: Normal range of motion.  Neurological: She is alert and oriented to person, place, and time. She has normal strength. No cranial nerve deficit or sensory deficit. Coordination normal. GCS eye subscore is 4. GCS verbal subscore is 5. GCS motor subscore is 6.  Extraocular muscle movement: normal No visual field cut Pupils: equal and reactive both direct and consensual response is normal No nystagmus present    Sensory function is intact to light touch, pinprick Proprioception intact  Grip strength 5/5 symmetric in upper extremities Lower extremity strength 5/5 against gravity No pronator drift Normal finger to nose bilaterally Normal heel to shin bilaterally  Gait: normal  Skin: Skin is warm, dry and intact. No rash noted. No cyanosis.  Psychiatric: She has a normal mood and affect. Her speech is normal and behavior is normal. Thought content normal.    ED Course  Procedures (including critical care time) Labs Review Labs Reviewed - No data to display  Imaging Review Ct Head Wo Contrast  06/30/2014   CLINICAL DATA:  Migraine headaches for 3 days   EXAM: CT HEAD WITHOUT CONTRAST  TECHNIQUE: Contiguous axial images were obtained from the base of the skull through the vertex without intravenous contrast.  COMPARISON:  02/23/2005  FINDINGS: No acute cortical infarct, hemorrhage, or mass lesion ispresent. Ventricles are of normal size. No significant extra-axial fluid collection is present. The paranasal sinuses andmastoid air cells are clear. The osseous skull is intact.  IMPRESSION: 1. No acute intracranial abnormalities.  Normal brain.   Electronically Signed   By: Signa Kell M.D.   On: 06/30/2014 21:10     EKG Interpretation None      MDM   Final diagnoses:  None   headache, possibly migraine  Patient presents to the ER for evaluation of headache. Patient reports headache over the frontal region, left of center since yesterday. She has taken over-the-counter analgesics without improvement. Patient is not vomiting any neck pain or stiffness. No meningismus on examination. She has a normal neurologic exam. A CT of her head was performed she does not typically have headaches. The CT was normal. Patient will be treated with migraine cocktail education, discharge to home.    Gilda Crease, MD 06/30/14 2119

## 2014-06-30 NOTE — Discharge Instructions (Signed)
General Headache Without Cause  A general headache is pain or discomfort felt around the head or neck area. The cause may not be found.   HOME CARE   · Keep all doctor visits.  · Only take medicines as told by your doctor.  · Lie down in a dark, quiet room when you have a headache.  · Keep a journal to find out if certain things bring on headaches. For example, write down:  ¨ What you eat and drink.  ¨ How much sleep you get.  ¨ Any change to your diet or medicines.  · Relax by getting a massage or doing other relaxing activities.  · Put ice or heat packs on the head and neck area as told by your doctor.  · Lessen stress.  · Sit up straight. Do not tighten (tense) your muscles.  · Quit smoking if you smoke.  · Lessen how much alcohol you drink.  · Lessen how much caffeine you drink, or stop drinking caffeine.  · Eat and sleep on a regular schedule.  · Get 7 to 9 hours of sleep, or as told by your doctor.  · Keep lights dim if bright lights bother you or make your headaches worse.  GET HELP RIGHT AWAY IF:   · Your headache becomes really bad.  · You have a fever.  · You have a stiff neck.  · You have trouble seeing.  · Your muscles are weak, or you lose muscle control.  · You lose your balance or have trouble walking.  · You feel like you will pass out (faint), or you pass out.  · You have really bad symptoms that are different than your first symptoms.  · You have problems with the medicines given to you by your doctor.  · Your medicines do not work.  · Your headache feels different than the other headaches.  · You feel sick to your stomach (nauseous) or throw up (vomit).  MAKE SURE YOU:   · Understand these instructions.  · Will watch your condition.  · Will get help right away if you are not doing well or get worse.  Document Released: 07/14/2008 Document Revised: 12/28/2011 Document Reviewed: 09/25/2011  ExitCare® Patient Information ©2015 [employer]. This information is not intended to replace advice given to  you by your health care provider. Make sure you discuss any questions you have with your health care provider.

## 2014-06-30 NOTE — ED Notes (Signed)
Pt reports having a migraine since yesterday, took Benadryl for it last night prior to going to sleep but woke up with migraine this morning. C/o nausea.

## 2014-08-20 ENCOUNTER — Encounter (HOSPITAL_COMMUNITY): Payer: Self-pay | Admitting: Emergency Medicine

## 2015-02-13 ENCOUNTER — Encounter (HOSPITAL_COMMUNITY): Payer: Self-pay | Admitting: Emergency Medicine

## 2015-02-13 ENCOUNTER — Emergency Department (HOSPITAL_COMMUNITY)
Admission: EM | Admit: 2015-02-13 | Discharge: 2015-02-13 | Disposition: A | Discharge status: Home / Self Care | Payer: Medicaid Other | Attending: Emergency Medicine | Admitting: Emergency Medicine

## 2015-02-13 DIAGNOSIS — J029 Acute pharyngitis, unspecified: Secondary | ICD-10-CM | POA: Insufficient documentation

## 2015-02-13 DIAGNOSIS — J3489 Other specified disorders of nose and nasal sinuses: Secondary | ICD-10-CM | POA: Diagnosis not present

## 2015-02-13 DIAGNOSIS — R05 Cough: Secondary | ICD-10-CM | POA: Insufficient documentation

## 2015-02-13 DIAGNOSIS — R52 Pain, unspecified: Secondary | ICD-10-CM | POA: Diagnosis present

## 2015-02-13 DIAGNOSIS — Z720 Tobacco use: Secondary | ICD-10-CM | POA: Insufficient documentation

## 2015-02-13 DIAGNOSIS — M791 Myalgia: Secondary | ICD-10-CM | POA: Insufficient documentation

## 2015-02-13 DIAGNOSIS — R6889 Other general symptoms and signs: Secondary | ICD-10-CM

## 2015-02-13 DIAGNOSIS — R0981 Nasal congestion: Secondary | ICD-10-CM | POA: Insufficient documentation

## 2015-02-13 MED ORDER — LORATADINE 10 MG PO TABS: 10.0000 mg | ORAL_TABLET | Freq: Every day | ORAL | Status: DC

## 2015-02-13 MED ORDER — BENZONATATE 200 MG PO CAPS: 200.0000 mg | ORAL_CAPSULE | Freq: Three times a day (TID) | ORAL | Status: DC | PRN

## 2015-02-13 NOTE — ED Notes (Signed)
Patient c/o generalized body aches with congestion, cough and sore throat x 3 days.

## 2015-02-13 NOTE — Discharge Instructions (Signed)
Take ibuprofen and/or tylenol for headache, fever and aching. Return for worsening symptoms.

## 2015-02-13 NOTE — ED Provider Notes (Signed)
CSN: [id number]     Arrival date & time 02/13/15  2124 History   First MD Initiated Contact with Patient 02/13/15 2205     Chief Complaint  Patient presents with  . Generalized Body Aches     (Consider location/radiation/quality/duration/timing/severity/associated sxs/prior Treatment) Patient is a 24 y.o. female presenting with URI.  URI Presenting symptoms: congestion, cough and sore throat   Severity:  Moderate Onset quality:  Gradual Duration:  3 days Timing:  Constant Progression:  Worsening Chronicity:  New Relieved by:  Nothing Worsened by:  Nothing tried Ineffective treatments:  OTC medications Associated symptoms: myalgias   Risk factors: sick contacts    Crystal Gould is a 24 y.o. female who presents to the ED with dry cough, congestion, sore throat, aching and other flu like symptoms that started 3 days ago. She states that all three of her children has had similar symptoms last week and were out of school but seem to be getting better with OTC medications. She has been taking medication for congestion but it hasn't seemed to help much.   History reviewed. No pertinent past medical history. History reviewed. No pertinent past surgical history. No family history on file. History  Substance Use Topics  . Smoking status: Current Every Day Smoker    Types: Cigarettes  . Smokeless tobacco: Not on file  . Alcohol Use: Yes     Comment: sometimes   OB History    Gravida Para Term Preterm AB TAB SAB Ectopic Multiple Living   1              Review of Systems  HENT: Positive for congestion and sore throat.   Respiratory: Positive for cough.   Musculoskeletal: Positive for myalgias.  all other systems negative    Allergies  Review of patient's allergies indicates no known allergies.  Home Medications   Prior to Admission medications   Medication Sig Start Date End Date Taking? Authorizing Provider  benzonatate (TESSALON) 200 MG capsule Take 1 capsule (200 mg  total) by mouth 3 (three) times daily as needed for cough. 02/13/15   Hope Orlene OchM Neese, NP  loratadine (CLARITIN) 10 MG tablet Take 1 tablet (10 mg total) by mouth daily. 02/13/15   Hope Orlene OchM Neese, NP  traMADol (ULTRAM) 50 MG tablet Take 1 tablet (50 mg total) by mouth every 6 (six) hours as needed. Patient not taking: Reported on 02/13/2015 06/30/14   Gilda Creasehristopher J Pollina, MD   BP 117/77 mmHg  Pulse 88  Temp(Src) 100.5 F (38.1 C) (Oral)  Resp 20  Ht 5\' 2"  (1.575 m)  Wt 137 lb (62.143 kg)  BMI 25.05 kg/m2  SpO2 100%  LMP 02/07/2015 Physical Exam  Constitutional: She is oriented to person, place, and time. She appears well-developed and well-nourished.  HENT:  Head: Normocephalic.  Right Ear: Tympanic membrane normal.  Left Ear: Tympanic membrane normal.  Nose: Mucosal edema and rhinorrhea present.  Mouth/Throat: Uvula is midline and oropharynx is clear and moist.  Eyes: Conjunctivae and EOM are normal.  Neck: Neck supple.  Cardiovascular: Normal rate.   Pulmonary/Chest: Effort normal.  Abdominal: Soft. There is no tenderness.  Musculoskeletal: Normal range of motion.  Neurological: She is alert and oriented to person, place, and time. No cranial nerve deficit.  Skin: Skin is warm and dry.  Psychiatric: She has a normal mood and affect. Her behavior is normal.  Nursing note and vitals reviewed.   ED Course  Procedures (including critical care time) Labs Review  MDM  SUBJECTIVE:  Crystal Gould is a 24 y.o. female who present complaining of flu-like symptoms: fevers, chills, myalgias, congestion, sore throat and cough for 3 days. Symptoms are similar to what her 3 children have had.  Denies dyspnea or wheezing.  OBJECTIVE: Appears moderately ill but not toxic; temperature as noted in vitals. Ears normal. Throat and pharynx normal.  Neck supple. No adenopathy in the neck. Sinuses non tender. The chest is clear.  ASSESSMENT: Flu like symptoms  PLAN: Symptomatic therapy  suggested: rest, increase fluids, gargle prn for sore throat, use mist of vaporizer prn and return prn if symptoms persist or worsen.       [address] [place], [place] 02/14/15 1557  Blane Ohara, MD 02/15/15 1153

## 2016-07-06 IMAGING — CT CT HEAD W/O CM
1 series · 16 of 30 positions shown, 20 images · non-contrast
Comparison: 02/23/2005

CLINICAL DATA: Migraine headaches for 3 days

EXAM:
CT HEAD WITHOUT CONTRAST
TECHNIQUE: Contiguous axial images were obtained from the base of the skull
through the vertex without intravenous contrast.

[Series 2: headseq 4.8 h37s · axial · 0.42mm/px · z∈[+60,+189]mm · 16 of 30 slices shown, 20 images]
[im 2/30  brain]
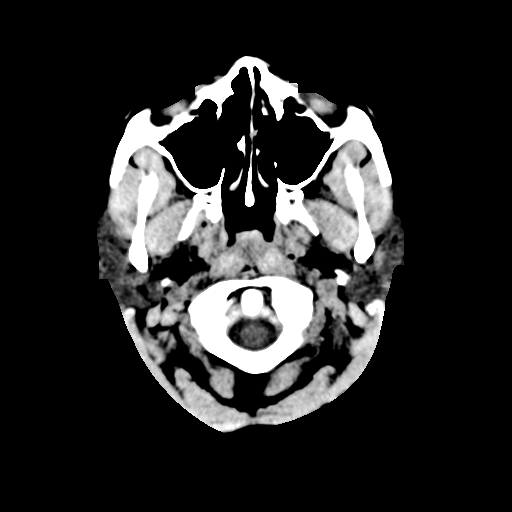
[im 2/30  bone]
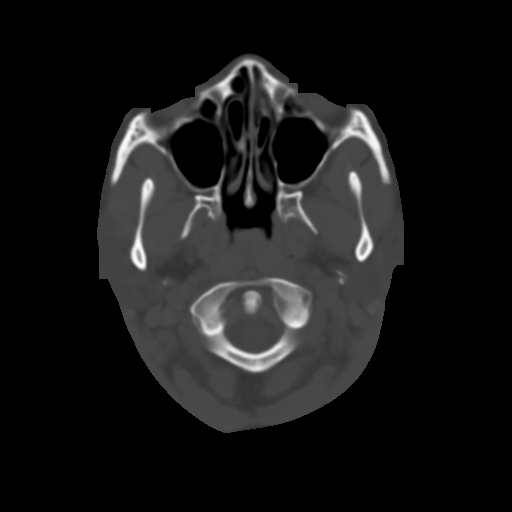
[im 4/30  brain]
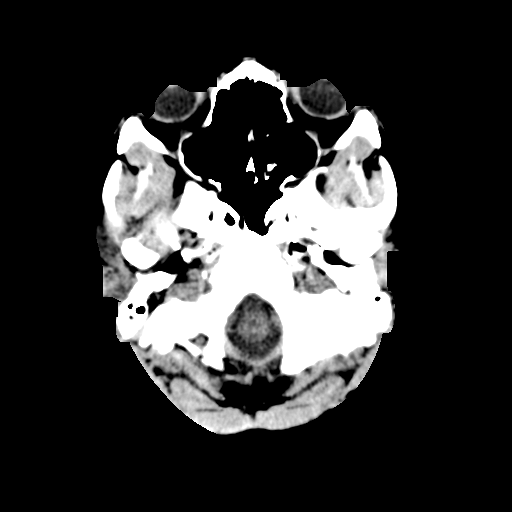
[im 6/30  brain]
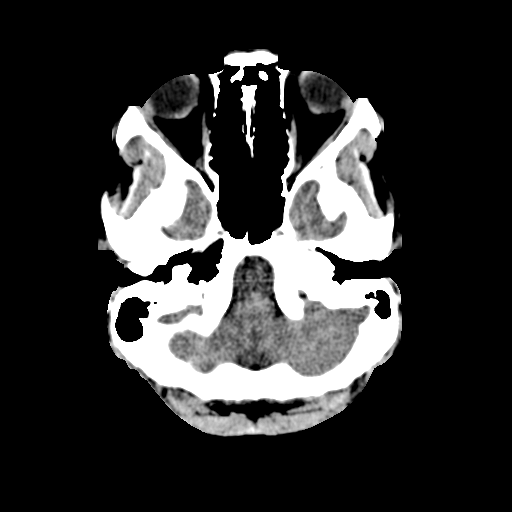
[im 8/30  brain]
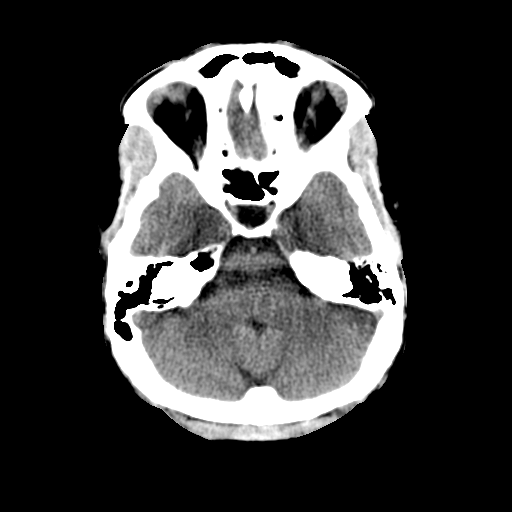
[im 9/30  brain]
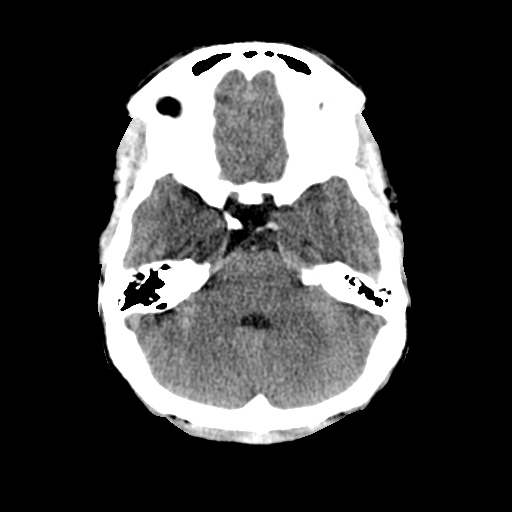
[im 9/30  bone]
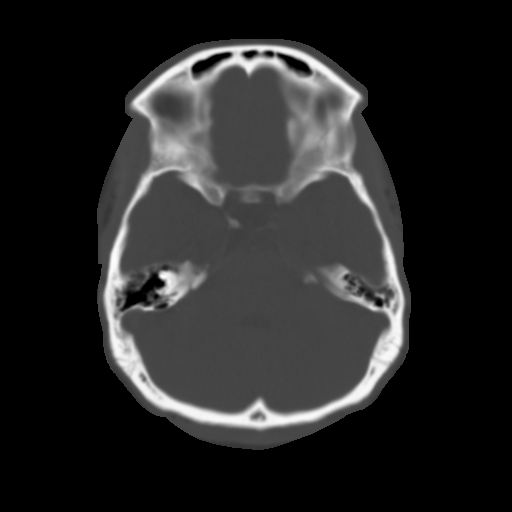
[im 11/30  brain]
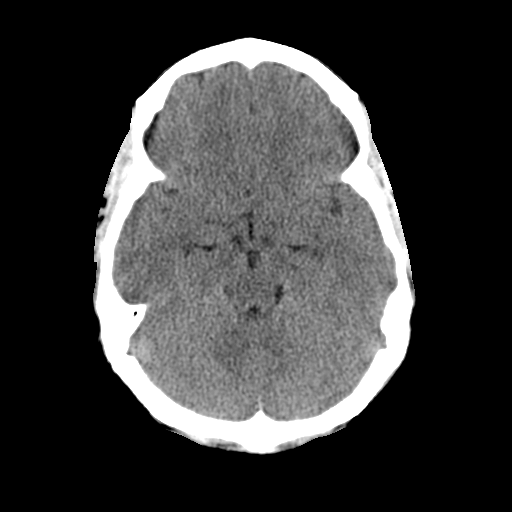
[im 13/30  brain]
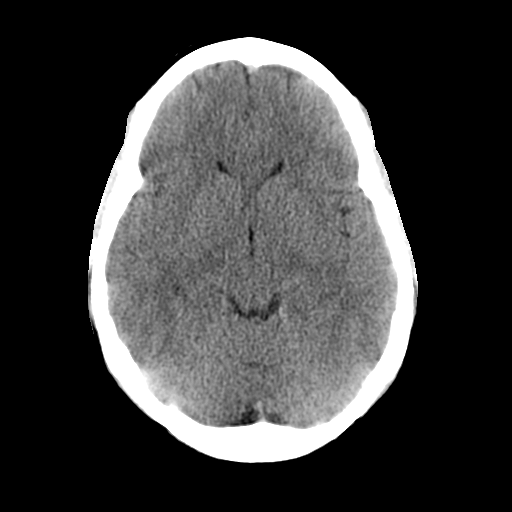
[im 15/30  brain]
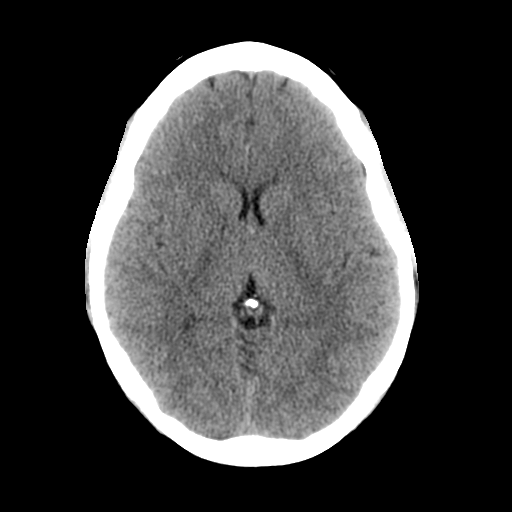
[im 16/30  brain]
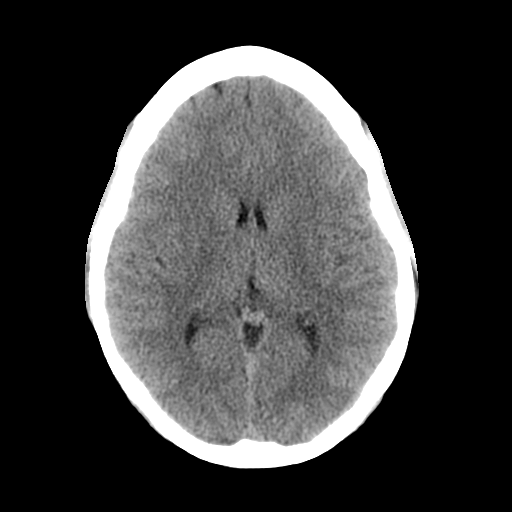
[im 16/30  bone]
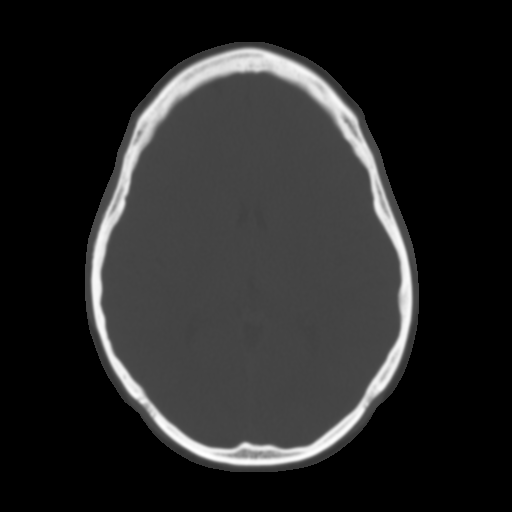
[im 18/30  brain]
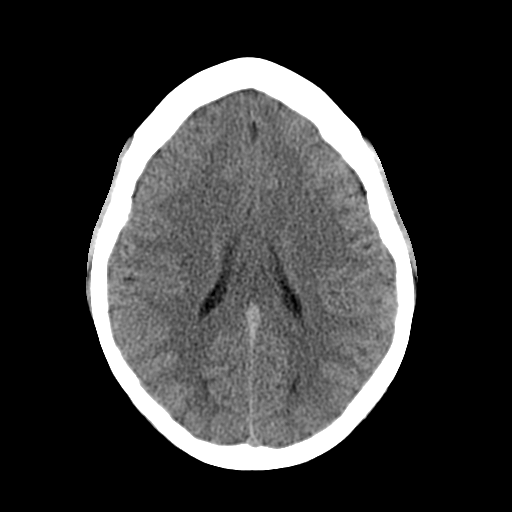
[im 20/30  brain]
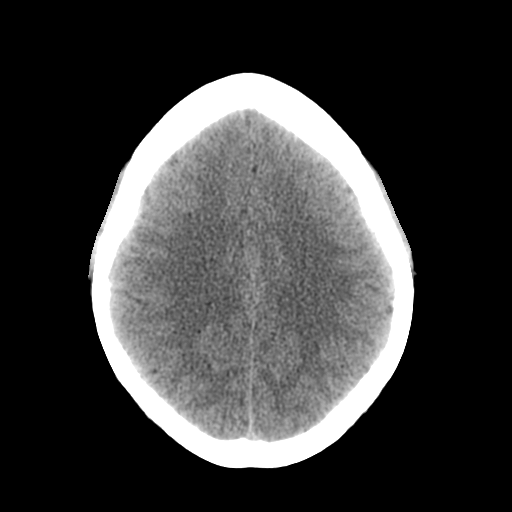
[im 22/30  brain]
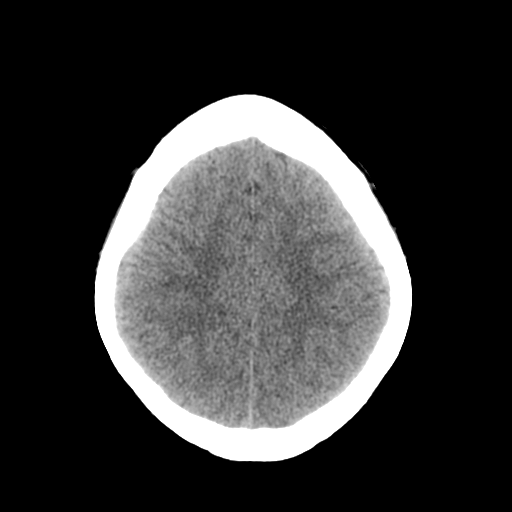
[im 23/30  brain]
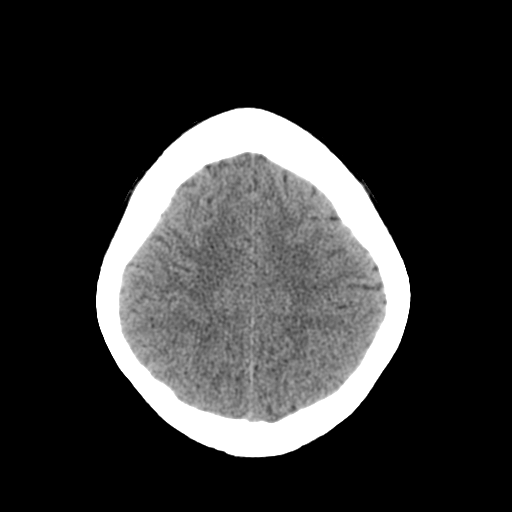
[im 23/30  bone]
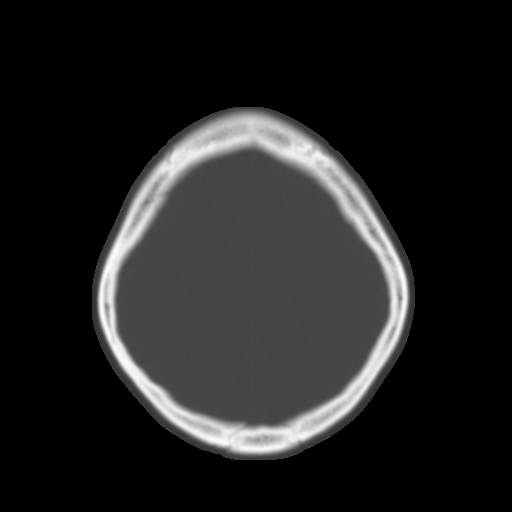
[im 25/30  brain]
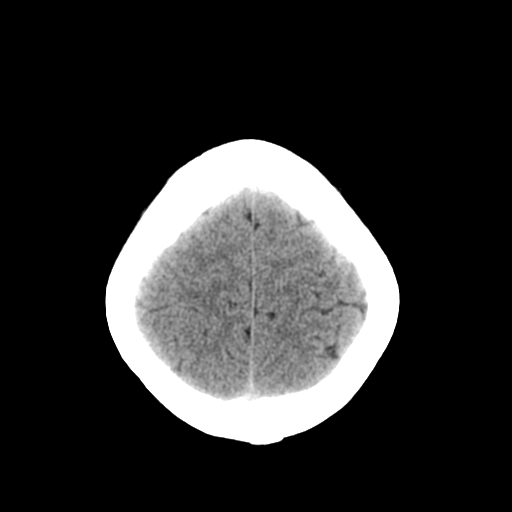
[im 27/30  brain]
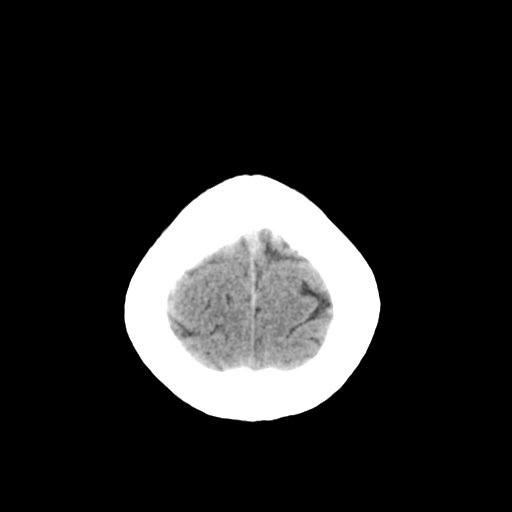
[im 29/30  brain]
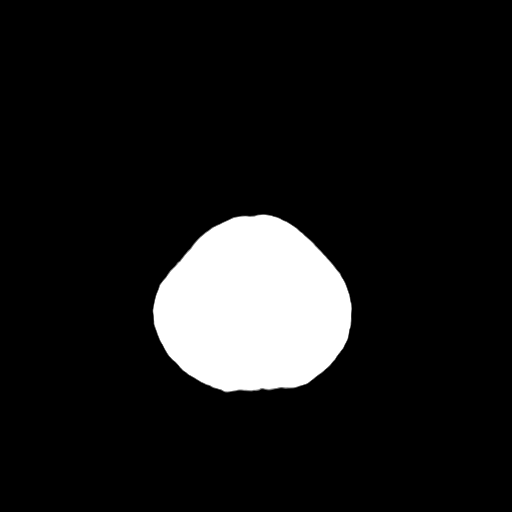

[16 of 30 positions shown; findings below may reference images not displayed]

FINDINGS: No acute cortical infarct, hemorrhage, or mass lesion ispresent.
Ventricles are of normal size. No significant extra-axial fluid
collection is present. The paranasal sinuses andmastoid air cells
are clear. The osseous skull is intact.
IMPRESSION: 1. No acute intracranial abnormalities.  Normal brain.

## 2018-10-19 NOTE — L&D Delivery Note (Signed)
LABOR COURSE Patient was admitted to unit in active labor, s/p SROM at 0345 with meconium.    Delivery Note Called to room and patient was complete and pushing. Head delivered ROT. No nuchal cord present. Compound hand presentation noted. Shoulder and body delivered in usual fashion. At 0453 a viable female was delivered via Vaginal, Spontaneous (Presentation:ROT ; ROA).  Infant with spontaneous cry, placed on mother's abdomen, dried and stimulated. Cord clamped x 2 after one-minute delay, and cut by patient. Cord blood drawn. Placenta delivered spontaneously with gentle cord traction. Appears intact. Fundus firm with massage and Pitocin. Labia, perineum, vagina, and cervix inspected.    APGAR:8 , 9; weight: 3135g  .   Cord: 3VC with the following complications: N/A.   Cord pH: Not collected  Anesthesia:  None Episiotomy: None Lacerations: None Est. Blood Loss (mL): 91  Mom to postpartum.  Baby to Couplet care / Skin to Skin.  Mallie Snooks, CNM 06/30/19 6:08 AM

## 2018-11-16 ENCOUNTER — Ambulatory Visit: Payer: Medicaid Other | Admitting: Adult Health

## 2018-11-16 ENCOUNTER — Encounter: Payer: Self-pay | Admitting: Adult Health

## 2018-11-16 ENCOUNTER — Other Ambulatory Visit: Payer: Self-pay

## 2018-11-16 VITALS — BP 109/73 | HR 76 | Ht 62.0 in | Wt 135.0 lb

## 2018-11-16 DIAGNOSIS — O3680X0 Pregnancy with inconclusive fetal viability, not applicable or unspecified: Secondary | ICD-10-CM | POA: Insufficient documentation

## 2018-11-16 DIAGNOSIS — Z3201 Encounter for pregnancy test, result positive: Secondary | ICD-10-CM | POA: Diagnosis not present

## 2018-11-16 DIAGNOSIS — Z3A01 Less than 8 weeks gestation of pregnancy: Secondary | ICD-10-CM | POA: Insufficient documentation

## 2018-11-16 DIAGNOSIS — N926 Irregular menstruation, unspecified: Secondary | ICD-10-CM

## 2018-11-16 LAB — POCT URINE PREGNANCY: PREG TEST UR: POSITIVE — AB

## 2018-11-16 MED ORDER — PRENATAL PLUS 27-1 MG PO TABS: 1.0000 | ORAL_TABLET | Freq: Every day | ORAL | 12 refills | Status: AC

## 2018-11-16 NOTE — Progress Notes (Signed)
Patient ID: Crystal Gould, female   DOB: [birth year], 28 y.o.   MRN: [id number] History of Present Illness: Crystal Gould is a 28 year old black female, single in for UPt, has missed period and had 3+HPTs. She works for [employer] in [place] and lives in [place].    Current Medications, Allergies, Past Medical History, Past Surgical History, Family History and Social History were reviewed in [employer] record.     Review of Systems:  +missed period with 3+HPTs +cramping at times.   Physical Exam:BP 109/73 (BP Location: Left Arm, Patient Position: Sitting, Cuff Size: Normal)   Pulse 76   Ht 5\' 2"  (1.575 m)   Wt 135 lb (61.2 kg)   LMP 09/28/2018 (Exact Date)   BMI 24.69 kg/m   UPT +, about 7 weeks by LMP with EDD 07/05/2019. General:  Well developed, well nourished, no acute distress Skin:  Warm and dry Neck:  Midline trachea, normal thyroid, good ROM, no lymphadenopathy Lungs; Clear to auscultation bilaterally Cardiovascular: Regular rate and rhythm Abdomen:  Soft, non tender Psych:  No mood changes, alert and cooperative,seems happy Fall risk is low. PHQ 2 score 0.  Impression:  1. Pregnancy test positive   2. Less than [redacted] weeks gestation of pregnancy   3. Encounter to determine fetal viability of pregnancy, single or unspecified fetus      Plan:  Meds ordered this encounter  Medications  . prenatal vitamin w/FE, FA (PRENATAL 1 + 1) 27-1 MG TABS tablet    Sig: Take 1 tablet by mouth daily at 12 noon.    Dispense:  30 each    Refill:  12    Order Specific Question:   Supervising Provider    Answer:   Lazaro Arms [[id number]]  Return in 1 week for dating US/3 weeks for new OB Review handouts on First trimester and by [facility] E[facility] often Push fluids

## 2018-11-16 NOTE — Patient Instructions (Signed)
First Trimester of Pregnancy  The first trimester of pregnancy is from week 1 until the end of week 13 (months 1 through 3). A week after a sperm fertilizes an egg, the egg will implant on the wall of the uterus. This embryo will begin to develop into a baby. Genes from you and your partner will form the baby. The female genes will determine whether the baby will be a boy or a girl. At 6-8 weeks, the eyes and face will be formed, and the heartbeat can be seen on ultrasound. At the end of 12 weeks, all the baby's organs will be formed.  Now that you are pregnant, you will want to do everything you can to have a healthy baby. Two of the most important things are to get good prenatal care and to follow your health care provider's instructions. Prenatal care is all the medical care you receive before the baby's birth. This care will help prevent, find, and treat any problems during the pregnancy and childbirth.  Body changes during your first trimester  Your body goes through many changes during pregnancy. The changes vary from woman to woman.   You may gain or lose a couple of pounds at first.   You may feel sick to your stomach (nauseous) and you may throw up (vomit). If the vomiting is uncontrollable, call your health care provider.   You may tire easily.   You may develop headaches that can be relieved by medicines. All medicines should be approved by your health care provider.   You may urinate more often. Painful urination may mean you have a bladder infection.   You may develop heartburn as a result of your pregnancy.   You may develop constipation because certain hormones are causing the muscles that push stool through your intestines to slow down.   You may develop hemorrhoids or swollen veins (varicose veins).   Your breasts may begin to grow larger and become tender. Your nipples may stick out more, and the tissue that surrounds them (areola) may become darker.   Your gums may bleed and may be  sensitive to brushing and flossing.   Dark spots or blotches (chloasma, mask of pregnancy) may develop on your face. This will likely fade after the baby is born.   Your menstrual periods will stop.   You may have a loss of appetite.   You may develop cravings for certain kinds of food.   You may have changes in your emotions from day to day, such as being excited to be pregnant or being concerned that something may go wrong with the pregnancy and baby.   You may have more vivid and strange dreams.   You may have changes in your hair. These can include thickening of your hair, rapid growth, and changes in texture. Some women also have hair loss during or after pregnancy, or hair that feels dry or thin. Your hair will most likely return to normal after your baby is born.  What to expect at prenatal visits  During a routine prenatal visit:   You will be weighed to make sure you and the baby are growing normally.   Your blood pressure will be taken.   Your abdomen will be measured to track your baby's growth.   The fetal heartbeat will be listened to between weeks 10 and 14 of your pregnancy.   Test results from any previous visits will be discussed.  Your health care provider may ask you:     How you are feeling.   If you are feeling the baby move.   If you have had any abnormal symptoms, such as leaking fluid, bleeding, severe headaches, or abdominal cramping.   If you are using any tobacco products, including cigarettes, chewing tobacco, and electronic cigarettes.   If you have any questions.  Other tests that may be performed during your first trimester include:   Blood tests to find your blood type and to check for the presence of any previous infections. The tests will also be used to check for low iron levels (anemia) and protein on red blood cells (Rh antibodies). Depending on your risk factors, or if you previously had diabetes during pregnancy, you may have tests to check for high blood sugar  that affects pregnant women (gestational diabetes).   Urine tests to check for infections, diabetes, or protein in the urine.   An ultrasound to confirm the proper growth and development of the baby.   Fetal screens for spinal cord problems (spina bifida) and Down syndrome.   HIV (human immunodeficiency virus) testing. Routine prenatal testing includes screening for HIV, unless you choose not to have this test.   You may need other tests to make sure you and the baby are doing well.  Follow these instructions at home:  Medicines   Follow your health care provider's instructions regarding medicine use. Specific medicines may be either safe or unsafe to take during pregnancy.   Take a prenatal vitamin that contains at least 600 micrograms (mcg) of folic acid.   If you develop constipation, try taking a stool softener if your health care provider approves.  Eating and drinking     Eat a balanced diet that includes fresh fruits and vegetables, whole grains, good sources of protein such as meat, eggs, or tofu, and low-fat dairy. Your health care provider will help you determine the amount of weight gain that is right for you.   Avoid raw meat and uncooked cheese. These carry germs that can cause birth defects in the baby.   Eating four or five small meals rather than three large meals a day may help relieve nausea and vomiting. If you start to feel nauseous, eating a few soda crackers can be helpful. Drinking liquids between meals, instead of during meals, also seems to help ease nausea and vomiting.   Limit foods that are high in fat and processed sugars, such as fried and sweet foods.   To prevent constipation:  ? Eat foods that are high in fiber, such as fresh fruits and vegetables, whole grains, and beans.  ? Drink enough fluid to keep your urine clear or pale yellow.  Activity   Exercise only as directed by your health care provider. Most women can continue their usual exercise routine during  pregnancy. Try to exercise for 30 minutes at least 5 days a week. Exercising will help you:  ? Control your weight.  ? Stay in shape.  ? Be prepared for labor and delivery.   Experiencing pain or cramping in the lower abdomen or lower back is a good sign that you should stop exercising. Check with your health care provider before continuing with normal exercises.   Try to avoid standing for long periods of time. Move your legs often if you must stand in one place for a long time.   Avoid heavy lifting.   Wear low-heeled shoes and practice good posture.   You may continue to have sex unless your health care   provider tells you not to.  Relieving pain and discomfort   Wear a good support bra to relieve breast tenderness.   Take warm sitz baths to soothe any pain or discomfort caused by hemorrhoids. Use hemorrhoid cream if your health care provider approves.   Rest with your legs elevated if you have leg cramps or low back pain.   If you develop varicose veins in your legs, wear support hose. Elevate your feet for 15 minutes, 3-4 times a day. Limit salt in your diet.  Prenatal care   Schedule your prenatal visits by the twelfth week of pregnancy. They are usually scheduled monthly at first, then more often in the last 2 months before delivery.   Write down your questions. Take them to your prenatal visits.   Keep all your prenatal visits as told by your health care provider. This is important.  Safety   Wear your seat belt at all times when driving.   Make a list of emergency phone numbers, including numbers for family, friends, the hospital, and police and fire departments.  General instructions   Ask your health care provider for a referral to a local prenatal education class. Begin classes no later than the beginning of month 6 of your pregnancy.   Ask for help if you have counseling or nutritional needs during pregnancy. Your health care provider can offer advice or refer you to specialists for help  with various needs.   Do not use hot tubs, steam rooms, or saunas.   Do not douche or use tampons or scented sanitary pads.   Do not cross your legs for long periods of time.   Avoid cat litter boxes and soil used by cats. These carry germs that can cause birth defects in the baby and possibly loss of the fetus by miscarriage or stillbirth.   Avoid all smoking, herbs, alcohol, and medicines not prescribed by your health care provider. Chemicals in these products affect the formation and growth of the baby.   Do not use any products that contain nicotine or tobacco, such as cigarettes and e-cigarettes. If you need help quitting, ask your health care provider. You may receive counseling support and other resources to help you quit.   Schedule a dentist appointment. At home, brush your teeth with a soft toothbrush and be gentle when you floss.  Contact a health care provider if:   You have dizziness.   You have mild pelvic cramps, pelvic pressure, or nagging pain in the abdominal area.   You have persistent nausea, vomiting, or diarrhea.   You have a bad smelling vaginal discharge.   You have pain when you urinate.   You notice increased swelling in your face, hands, legs, or ankles.   You are exposed to fifth disease or chickenpox.   You are exposed to German measles (rubella) and have never had it.  Get help right away if:   You have a fever.   You are leaking fluid from your vagina.   You have spotting or bleeding from your vagina.   You have severe abdominal cramping or pain.   You have rapid weight gain or loss.   You vomit blood or material that looks like coffee grounds.   You develop a severe headache.   You have shortness of breath.   You have any kind of trauma, such as from a fall or a car accident.  Summary   The first trimester of pregnancy is from week 1 until   the end of week 13 (months 1 through 3).   Your body goes through many changes during pregnancy. The changes vary from  woman to woman.   You will have routine prenatal visits. During those visits, your health care provider will examine you, discuss any test results you may have, and talk with you about how you are feeling.  This information is not intended to replace advice given to you by your health care provider. Make sure you discuss any questions you have with your health care provider.  Document Released: 09/29/2001 Document Revised: 09/16/2016 Document Reviewed: 09/16/2016  [employer] Interactive Patient Education  2019 [employer]

## 2018-11-23 ENCOUNTER — Ambulatory Visit (INDEPENDENT_AMBULATORY_CARE_PROVIDER_SITE_OTHER): Payer: Medicaid Other

## 2018-11-23 DIAGNOSIS — O3680X0 Pregnancy with inconclusive fetal viability, not applicable or unspecified: Secondary | ICD-10-CM

## 2018-11-23 NOTE — Progress Notes (Signed)
[place] 8 wks,single IUP w/ys,positive fht 169 bpm,normal ovaries bilat,crl 16.50 mm

## 2018-12-07 ENCOUNTER — Ambulatory Visit: Payer: Medicaid Other | Admitting: *Deleted

## 2018-12-07 ENCOUNTER — Other Ambulatory Visit: Payer: Self-pay

## 2018-12-07 ENCOUNTER — Ambulatory Visit (INDEPENDENT_AMBULATORY_CARE_PROVIDER_SITE_OTHER): Payer: Medicaid Other | Admitting: Advanced Practice Midwife

## 2018-12-07 ENCOUNTER — Encounter: Payer: Self-pay | Admitting: Advanced Practice Midwife

## 2018-12-07 ENCOUNTER — Other Ambulatory Visit (HOSPITAL_COMMUNITY)
Admission: RE | Admit: 2018-12-07 | Discharge: 2018-12-07 | Disposition: A | Discharge status: Home / Self Care | Payer: Medicaid Other | Source: Ambulatory Visit | Attending: Advanced Practice Midwife | Admitting: Advanced Practice Midwife

## 2018-12-07 VITALS — BP 128/88 | HR 88 | Wt 138.0 lb

## 2018-12-07 DIAGNOSIS — Z1379 Encounter for other screening for genetic and chromosomal anomalies: Secondary | ICD-10-CM

## 2018-12-07 DIAGNOSIS — Z124 Encounter for screening for malignant neoplasm of cervix: Secondary | ICD-10-CM

## 2018-12-07 DIAGNOSIS — Z3682 Encounter for antenatal screening for nuchal translucency: Secondary | ICD-10-CM

## 2018-12-07 DIAGNOSIS — Z3481 Encounter for supervision of other normal pregnancy, first trimester: Secondary | ICD-10-CM | POA: Diagnosis not present

## 2018-12-07 DIAGNOSIS — Z3A10 10 weeks gestation of pregnancy: Secondary | ICD-10-CM

## 2018-12-07 DIAGNOSIS — Z1389 Encounter for screening for other disorder: Secondary | ICD-10-CM

## 2018-12-07 DIAGNOSIS — Z331 Pregnant state, incidental: Secondary | ICD-10-CM

## 2018-12-07 DIAGNOSIS — Z3490 Encounter for supervision of normal pregnancy, unspecified, unspecified trimester: Secondary | ICD-10-CM | POA: Insufficient documentation

## 2018-12-07 NOTE — Addendum Note (Signed)
Addended by: Federico Flake A on: 12/07/2018 02:49 PM   Modules accepted: Orders

## 2018-12-07 NOTE — Patient Instructions (Signed)
 First Trimester of Pregnancy The first trimester of pregnancy is from week 1 until the end of week 12 (months 1 through 3). A week after a sperm fertilizes an egg, the egg will implant on the wall of the uterus. This embryo will begin to develop into a baby. Genes from you and your partner are forming the baby. The female genes determine whether the baby is a boy or a girl. At 6-8 weeks, the eyes and face are formed, and the heartbeat can be seen on ultrasound. At the end of 12 weeks, all the baby's organs are formed.  Now that you are pregnant, you will want to do everything you can to have a healthy baby. Two of the most important things are to get good prenatal care and to follow your health care provider's instructions. Prenatal care is all the medical care you receive before the baby's birth. This care will help prevent, find, and treat any problems during the pregnancy and childbirth. BODY CHANGES Your body goes through many changes during pregnancy. The changes vary from woman to woman.   You may gain or lose a couple of pounds at first.  You may feel sick to your stomach (nauseous) and throw up (vomit). If the vomiting is uncontrollable, call your health care provider.  You may tire easily.  You may develop headaches that can be relieved by medicines approved by your health care provider.  You may urinate more often. Painful urination may mean you have a bladder infection.  You may develop heartburn as a result of your pregnancy.  You may develop constipation because certain hormones are causing the muscles that push waste through your intestines to slow down.  You may develop hemorrhoids or swollen, bulging veins (varicose veins).  Your breasts may begin to grow larger and become tender. Your nipples may stick out more, and the tissue that surrounds them (areola) may become darker.  Your gums may bleed and may be sensitive to brushing and flossing.  Dark spots or blotches  (chloasma, mask of pregnancy) may develop on your face. This will likely fade after the baby is born.  Your menstrual periods will stop.  You may have a loss of appetite.  You may develop cravings for certain kinds of food.  You may have changes in your emotions from day to day, such as being excited to be pregnant or being concerned that something may go wrong with the pregnancy and baby.  You may have more vivid and strange dreams.  You may have changes in your hair. These can include thickening of your hair, rapid growth, and changes in texture. Some women also have hair loss during or after pregnancy, or hair that feels dry or thin. Your hair will most likely return to normal after your baby is born. WHAT TO EXPECT AT YOUR PRENATAL VISITS During a routine prenatal visit:  You will be weighed to make sure you and the baby are growing normally.  Your blood pressure will be taken.  Your abdomen will be measured to track your baby's growth.  The fetal heartbeat will be listened to starting around week 10 or 12 of your pregnancy.  Test results from any previous visits will be discussed. Your health care provider may ask you:  How you are feeling.  If you are feeling the baby move.  If you have had any abnormal symptoms, such as leaking fluid, bleeding, severe headaches, or abdominal cramping.  If you have any questions. Other   tests that may be performed during your first trimester include:  Blood tests to find your blood type and to check for the presence of any previous infections. They will also be used to check for low iron levels (anemia) and Rh antibodies. Later in the pregnancy, blood tests for diabetes will be done along with other tests if problems develop.  Urine tests to check for infections, diabetes, or protein in the urine.  An ultrasound to confirm the proper growth and development of the baby.  An amniocentesis to check for possible genetic problems.  Fetal  screens for spina bifida and Down syndrome.  You may need other tests to make sure you and the baby are doing well. HOME CARE INSTRUCTIONS  Medicines  Follow your health care provider's instructions regarding medicine use. Specific medicines may be either safe or unsafe to take during pregnancy.  Take your prenatal vitamins as directed.  If you develop constipation, try taking a stool softener if your health care provider approves. Diet  Eat regular, well-balanced meals. Choose a variety of foods, such as meat or vegetable-based protein, fish, milk and low-fat dairy products, vegetables, fruits, and whole grain breads and cereals. Your health care provider will help you determine the amount of weight gain that is right for you.  Avoid raw meat and uncooked cheese. These carry germs that can cause birth defects in the baby.  Eating four or five small meals rather than three large meals a day may help relieve nausea and vomiting. If you start to feel nauseous, eating a few soda crackers can be helpful. Drinking liquids between meals instead of during meals also seems to help nausea and vomiting.  If you develop constipation, eat more high-fiber foods, such as fresh vegetables or fruit and whole grains. Drink enough fluids to keep your urine clear or pale yellow. Activity and Exercise  Exercise only as directed by your health care provider. Exercising will help you:  Control your weight.  Stay in shape.  Be prepared for labor and delivery.  Experiencing pain or cramping in the lower abdomen or low back is a good sign that you should stop exercising. Check with your health care provider before continuing normal exercises.  Try to avoid standing for long periods of time. Move your legs often if you must stand in one place for a long time.  Avoid heavy lifting.  Wear low-heeled shoes, and practice good posture.  You may continue to have sex unless your health care provider directs you  otherwise. Relief of Pain or Discomfort  Wear a good support bra for breast tenderness.   Take warm sitz baths to soothe any pain or discomfort caused by hemorrhoids. Use hemorrhoid cream if your health care provider approves.   Rest with your legs elevated if you have leg cramps or low back pain.  If you develop varicose veins in your legs, wear support hose. Elevate your feet for 15 minutes, 3-4 times a day. Limit salt in your diet. Prenatal Care  Schedule your prenatal visits by the twelfth week of pregnancy. They are usually scheduled monthly at first, then more often in the last 2 months before delivery.  Write down your questions. Take them to your prenatal visits.  Keep all your prenatal visits as directed by your health care provider. Safety  Wear your seat belt at all times when driving.  Make a list of emergency phone numbers, including numbers for family, friends, the hospital, and police and fire departments. General   Tips  Ask your health care provider for a referral to a local prenatal education class. Begin classes no later than at the beginning of month 6 of your pregnancy.  Ask for help if you have counseling or nutritional needs during pregnancy. Your health care provider can offer advice or refer you to specialists for help with various needs.  Do not use hot tubs, steam rooms, or saunas.  Do not douche or use tampons or scented sanitary pads.  Do not cross your legs for long periods of time.  Avoid cat litter boxes and soil used by cats. These carry germs that can cause birth defects in the baby and possibly loss of the fetus by miscarriage or stillbirth.  Avoid all smoking, herbs, alcohol, and medicines not prescribed by your health care provider. Chemicals in these affect the formation and growth of the baby.  Schedule a dentist appointment. At home, brush your teeth with a soft toothbrush and be gentle when you floss. SEEK MEDICAL CARE IF:   You have  dizziness.  You have mild pelvic cramps, pelvic pressure, or nagging pain in the abdominal area.  You have persistent nausea, vomiting, or diarrhea.  You have a bad smelling vaginal discharge.  You have pain with urination.  You notice increased swelling in your face, hands, legs, or ankles. SEEK IMMEDIATE MEDICAL CARE IF:   You have a fever.  You are leaking fluid from your vagina.  You have spotting or bleeding from your vagina.  You have severe abdominal cramping or pain.  You have rapid weight gain or loss.  You vomit blood or material that looks like coffee grounds.  You are exposed to German measles and have never had them.  You are exposed to fifth disease or chickenpox.  You develop a severe headache.  You have shortness of breath.  You have any kind of trauma, such as from a fall or a car accident. Document Released: 09/29/2001 Document Revised: 02/19/2014 Document Reviewed: 08/15/2013 [employer] Patient Information 2015 [employer]. This information is not intended to replace advice given to you by your health care provider. Make sure you discuss any questions you have with your health care provider.   Nausea & Vomiting  Have saltine crackers or pretzels by your bed and eat a few bites before you raise your head out of bed in the morning  Eat small frequent meals throughout the day instead of large meals  Drink plenty of fluids throughout the day to stay hydrated, just don't drink a lot of fluids with your meals.  This can make your stomach fill up faster making you feel sick  Do not brush your teeth right after you eat  Products with real ginger are good for nausea, like ginger ale and ginger hard candy Make sure it says made with real ginger!  Sucking on sour candy like lemon heads is also good for nausea  If your prenatal vitamins make you nauseated, take them at night so you will sleep through the nausea  Sea Bands  If you feel like you need  medicine for the nausea & vomiting please let us know  If you are unable to keep any fluids or food down please let us know   Constipation  Drink plenty of fluid, preferably water, throughout the day  Eat foods high in fiber such as fruits, vegetables, and grains  Exercise, such as walking, is a good way to keep your bowels regular  Drink warm fluids, especially warm   prune juice, or decaf coffee  Eat a 1/2 cup of real oatmeal (not instant), 1/2 cup applesauce, and 1/2-1 cup warm prune juice every day  If needed, you may take Colace (docusate sodium) stool softener once or twice a day to help keep the stool soft. If you are pregnant, wait until you are out of your first trimester (12-14 weeks of pregnancy)  If you still are having problems with constipation, you may take Miralax once daily as needed to help keep your bowels regular.  If you are pregnant, wait until you are out of your first trimester (12-14 weeks of pregnancy)  Safe Medications in Pregnancy   Acne: Benzoyl Peroxide Salicylic Acid  Backache/Headache: Tylenol: 2 regular strength every 4 hours OR              2 Extra strength every 6 hours  Colds/Coughs/Allergies: Benadryl (alcohol free) 25 mg every 6 hours as needed Breath right strips Claritin Cepacol throat lozenges Chloraseptic throat spray Cold-Eeze- up to three times per day Cough drops, alcohol free Flonase (by prescription only) Guaifenesin Mucinex Robitussin DM (plain only, alcohol free) Saline nasal spray/drops Sudafed (pseudoephedrine) & Actifed ** use only after [redacted] weeks gestation and if you do not have high blood pressure Tylenol Vicks Vaporub Zinc lozenges Zyrtec   Constipation: Colace Ducolax suppositories Fleet enema Glycerin suppositories Metamucil Milk of magnesia Miralax Senokot Smooth move tea  Diarrhea: Kaopectate Imodium A-D  *NO pepto Bismol  Hemorrhoids: Anusol Anusol HC Preparation  H Tucks  Indigestion: Tums Maalox Mylanta Zantac  Pepcid  Insomnia: Benadryl (alcohol free) 25mg every 6 hours as needed Tylenol PM Unisom, no Gelcaps  Leg Cramps: Tums MagGel  Nausea/Vomiting:  Bonine Dramamine Emetrol Ginger extract Sea bands Meclizine  Nausea medication to take during pregnancy:  Unisom (doxylamine succinate 25 mg tablets) Take one tablet daily at bedtime. If symptoms are not adequately controlled, the dose can be increased to a maximum recommended dose of two tablets daily (1/2 tablet in the morning, 1/2 tablet mid-afternoon and one at bedtime). Vitamin B6 100mg tablets. Take one tablet twice a day (up to 200 mg per day).  Skin Rashes: Aveeno products Benadryl cream or 25mg every 6 hours as needed Calamine Lotion 1% cortisone cream  Yeast infection: Gyne-lotrimin 7 Monistat 7   **If taking multiple medications, please check labels to avoid duplicating the same active ingredients **take medication as directed on the label ** Do not exceed 4000 mg of tylenol in 24 hours **Do not take medications that contain aspirin or ibuprofen      

## 2018-12-07 NOTE — Progress Notes (Signed)
INITIAL OBSTETRICAL VISIT Patient name: Crystal Gould MRN [id number]  Date of birth: Oct 04, 1991 Chief Complaint:   Initial Prenatal Visit  History of Present Illness:   Crystal Gould is a 28 y.o. [id number] African American female at [redacted]w[redacted]d by LMP/8 week [place] with an Estimated Date of Delivery: 07/05/19 being seen today for her initial obstetrical visit.   Her obstetrical history is significant for 4 term SVDs w/o problems 1 TAB and 1 early SAB.   Today she reports nausea.  Patient's last menstrual period was 09/28/2018 (exact date). Last pap 2016. Results were: normal  States has a "bump" that she thinks came from shaving.  It has been noticeable for for a few days. . Review of Systems:   Pertinent items are noted in HPI Denies cramping/contractions, leakage of fluid, vaginal bleeding, abnormal vaginal discharge w/ itching/odor/irritation,  headaches, visual changes, shortness of breath, chest pain, abdominal pain, severe nausea/vomiting, or problems with urination or bowel movements unless otherwise stated above.  Pertinent History Reviewed:  Reviewed past medical,surgical, social, obstetrical and family history.  Reviewed problem list, medications and allergies. OB History  Gravida Para Term Preterm AB Living  7 4 4   2 4   SAB TAB Ectopic Multiple Live Births  1 1     4     # Outcome Date GA Lbr Len/2nd Weight Sex Delivery Anes PTL Lv  7 Current           6 SAB 05/2018             Birth Comments: System Generated. Please review and update pregnancy details.  5 Term 09/15/16 [redacted]w[redacted]d  7 lb 15 oz (3.6 kg) F  None N LIV  4 Term 03/05/12 [redacted]w[redacted]d  5 lb 3 oz (2.353 kg) F Vag-Spont None N LIV  3 TAB 2011          2 Term 12/13/07 [redacted]w[redacted]d  7 lb 15 oz (3.6 kg) F  None N LIV  1 Term 12/11/06 [redacted]w[redacted]d  5 lb 5 oz (2.41 kg) F Vag-Spont None N LIV   Physical Assessment:   Vitals:   12/07/18 1402  BP: 128/88  Pulse: 88  Weight: 138 lb (62.6 kg)  Body mass index is 25.24 kg/m.       Physical  Examination:  General appearance - well appearing, and in no distress  Mental status - alert, oriented to person, place, and time  Psych:  She has a normal mood and affect  Skin - warm and dry, normal color, no suspicious lesions noted  Chest - effort normal, all lung fields clear to auscultation bilaterally  Heart - normal rate and regular rhythm  Abdomen - soft, nontender  Extremities:  No swelling or varicosities noted   Pelvic - VULVA: normal appearing vulva with no masses, tenderness or lesions .  Small furuncle, not poppable VAGINA: normal appearing vagina with normal color and discharge, no lesions  CERVIX: normal appearing cervix without discharge or lesions, no CMT  Thin prep pap is done without HR HPV cotesting  Fetal Heart Rate (bpm): 160 via [place]  No results found for this or any previous visit (from the past 24 hour(s)).  Assessment & Plan:  1) Low-Risk Pregnancy [id number] at [redacted]w[redacted]d with an Estimated Date of Delivery: 07/05/19   2) Initial OB visit   Meds: No orders of the defined types were placed in this encounter.   Initial labs obtained--WILL NEED TO DRAW FRAGILEX AT NEXT VISIT, DON'T HAVE PROPER CODES TODAY  Continue prenatal vitamins Reviewed n/v relief measures and warning s/s to report Reviewed recommended weight gain based on pre-gravid BMI Encouraged well-balanced diet Genetic Screening discussed Integrated Screen: requested Watched video for carrier screening: Cystic fibrosis screening requested SMA screening requested Fragile X screening requested Ultrasound discussed; fetal survey: requested CCNC completed  Follow-up: Return in about 3 weeks (around 12/28/2018) for LROB, US:NT+1st IT.   Orders Placed This Encounter  Procedures  . GC/Chlamydia Probe Amp  . Urine Culture  . US Fetal Nuchal Translucency Measurement  . Obstetric Panel, Including HIV  . Urinalysis, Routine w reflex microscopic  . Sickle cell screen  . MaterniT 21 plus Core, Blood  . Pain  Management Screening Profile (10S)  . SMN1 COPY NUMBER ANALYSIS (SMA Carrier Screen)  . POC Urinalysis Dipstick OB    Jacklyn Shell DNP, CNM 12/07/2018 2:40 PM

## 2018-12-07 NOTE — Addendum Note (Signed)
Addended by: Federico Flake A on: 12/07/2018 02:45 PM   Modules accepted: Orders

## 2018-12-08 LAB — PMP SCREEN PROFILE (10S), URINE
Amphetamine Scrn, Ur: NEGATIVE ng/mL
BARBITURATE SCREEN URINE: NEGATIVE ng/mL
BENZODIAZEPINE SCREEN, URINE: NEGATIVE ng/mL
CANNABINOIDS UR QL SCN: NEGATIVE ng/mL
COCAINE(METAB.)SCREEN, URINE: NEGATIVE ng/mL
Creatinine(Crt), U: 125.3 mg/dL (ref 20.0–300.0)
Methadone Screen, Urine: NEGATIVE ng/mL
OPIATE SCREEN URINE: NEGATIVE ng/mL
OXYCODONE+OXYMORPHONE UR QL SCN: NEGATIVE ng/mL
Ph of Urine: 6.1 (ref 4.5–8.9)
Phencyclidine Qn, Ur: NEGATIVE ng/mL
Propoxyphene Scrn, Ur: NEGATIVE ng/mL

## 2018-12-09 LAB — GC/CHLAMYDIA PROBE AMP
CHLAMYDIA, DNA PROBE: POSITIVE — AB
Neisseria gonorrhoeae by PCR: NEGATIVE

## 2018-12-09 LAB — URINE CULTURE

## 2018-12-11 ENCOUNTER — Encounter: Payer: Self-pay | Admitting: Advanced Practice Midwife

## 2018-12-11 ENCOUNTER — Other Ambulatory Visit: Payer: Self-pay | Admitting: Advanced Practice Midwife

## 2018-12-11 DIAGNOSIS — A749 Chlamydial infection, unspecified: Secondary | ICD-10-CM | POA: Insufficient documentation

## 2018-12-11 MED ORDER — AZITHROMYCIN 500 MG PO TABS: 1000.0000 mg | ORAL_TABLET | Freq: Once | ORAL | 0 refills | Status: AC

## 2018-12-11 NOTE — Progress Notes (Signed)
Azithromycin for chl

## 2018-12-12 ENCOUNTER — Telehealth: Payer: Self-pay | Admitting: *Deleted

## 2018-12-12 LAB — OBSTETRIC PANEL, INCLUDING HIV
Antibody Screen: NEGATIVE
BASOS ABS: 0 10*3/uL (ref 0.0–0.2)
Basos: 0 %
EOS (ABSOLUTE): 0.1 10*3/uL (ref 0.0–0.4)
EOS: 1 %
HEMOGLOBIN: 12.3 g/dL (ref 11.1–15.9)
HEP B S AG: NEGATIVE
HIV Screen 4th Generation wRfx: NONREACTIVE
Hematocrit: 37.1 % (ref 34.0–46.6)
IMMATURE GRANULOCYTES: 0 %
Immature Grans (Abs): 0 10*3/uL (ref 0.0–0.1)
Lymphocytes Absolute: 1.4 10*3/uL (ref 0.7–3.1)
Lymphs: 15 %
MCH: 26.5 pg — ABNORMAL LOW (ref 26.6–33.0)
MCHC: 33.2 g/dL (ref 31.5–35.7)
MCV: 80 fL (ref 79–97)
MONOCYTES: 4 %
Monocytes Absolute: 0.4 10*3/uL (ref 0.1–0.9)
NEUTROS ABS: 7.5 10*3/uL — AB (ref 1.4–7.0)
NEUTROS PCT: 80 %
Platelets: 241 10*3/uL (ref 150–450)
RBC: 4.65 x10E6/uL (ref 3.77–5.28)
RDW: 13.8 % (ref 11.7–15.4)
RPR Ser Ql: NONREACTIVE
RUBELLA: 6.16 {index} (ref >0.99)
Rh Factor: POSITIVE
WBC: 9.5 10*3/uL (ref 3.4–10.8)

## 2018-12-12 LAB — MATERNIT 21 PLUS CORE, BLOOD
CHROMOSOME 18: NEGATIVE
Chromosome 13: NEGATIVE
Chromosome 21: NEGATIVE
Fetal Fraction: 11
Y CHROMOSOME: NOT DETECTED

## 2018-12-12 LAB — URINALYSIS, ROUTINE W REFLEX MICROSCOPIC
BILIRUBIN UA: NEGATIVE
Glucose, UA: NEGATIVE
Ketones, UA: NEGATIVE
NITRITE UA: NEGATIVE
Protein, UA: NEGATIVE
RBC UA: NEGATIVE
Specific Gravity, UA: 1.021 (ref 1.005–1.030)
UUROB: 0.2 mg/dL (ref 0.2–1.0)
pH, UA: 6 (ref 5.0–7.5)

## 2018-12-12 LAB — CYTOLOGY - PAP
CHLAMYDIA, DNA PROBE: POSITIVE — AB
Diagnosis: NEGATIVE
Neisseria Gonorrhea: NEGATIVE

## 2018-12-12 LAB — MICROSCOPIC EXAMINATION: CASTS: NONE SEEN /LPF

## 2018-12-12 LAB — SICKLE CELL SCREEN: SICKLE CELL SCREEN: NEGATIVE

## 2018-12-12 NOTE — Telephone Encounter (Signed)
Can you treat this boyfriend for +chlamydia at new ob 12/07/18?  Crystal Gould, DOB: [birth year], NKDA, [employer]

## 2018-12-12 NOTE — Telephone Encounter (Signed)
Patient made aware she has tested positive for chlamydia and prescription has been sent to pharmacy.  Informed partner needs treatment as well, we can treat or he can go to HD or PCP.  Advised no sex for 7 days from both being treated and will be retested at her next visit. Pt states she will call him and get back to [place] if we need to treat.

## 2018-12-19 ENCOUNTER — Other Ambulatory Visit: Payer: Self-pay | Admitting: Women's Health

## 2018-12-19 LAB — SMN1 COPY NUMBER ANALYSIS (SMA CARRIER SCREENING)

## 2018-12-19 NOTE — Progress Notes (Signed)
Called in azithromycin 1gm po x 1 w/ 0RF for Gretchen Portela, DOB: [birth year], NKDA, [address] [place], [facility] 12/19/2018 2:03 PM

## 2018-12-28 ENCOUNTER — Other Ambulatory Visit: Payer: Self-pay

## 2018-12-28 ENCOUNTER — Ambulatory Visit (INDEPENDENT_AMBULATORY_CARE_PROVIDER_SITE_OTHER): Payer: Medicaid Other

## 2018-12-28 ENCOUNTER — Ambulatory Visit (INDEPENDENT_AMBULATORY_CARE_PROVIDER_SITE_OTHER): Payer: Medicaid Other | Admitting: Obstetrics and Gynecology

## 2018-12-28 VITALS — BP 109/65 | HR 90 | Wt 139.4 lb

## 2018-12-28 DIAGNOSIS — Z3682 Encounter for antenatal screening for nuchal translucency: Secondary | ICD-10-CM

## 2018-12-28 DIAGNOSIS — Z3A13 13 weeks gestation of pregnancy: Secondary | ICD-10-CM | POA: Diagnosis not present

## 2018-12-28 DIAGNOSIS — Z3402 Encounter for supervision of normal first pregnancy, second trimester: Secondary | ICD-10-CM

## 2018-12-28 DIAGNOSIS — Z1379 Encounter for other screening for genetic and chromosomal anomalies: Secondary | ICD-10-CM

## 2018-12-28 DIAGNOSIS — A749 Chlamydial infection, unspecified: Secondary | ICD-10-CM

## 2018-12-28 DIAGNOSIS — Z3401 Encounter for supervision of normal first pregnancy, first trimester: Secondary | ICD-10-CM

## 2018-12-28 DIAGNOSIS — Z3009 Encounter for other general counseling and advice on contraception: Secondary | ICD-10-CM

## 2018-12-28 DIAGNOSIS — Z1389 Encounter for screening for other disorder: Secondary | ICD-10-CM

## 2018-12-28 DIAGNOSIS — Z331 Pregnant state, incidental: Secondary | ICD-10-CM

## 2018-12-28 DIAGNOSIS — Z309 Encounter for contraceptive management, unspecified: Secondary | ICD-10-CM | POA: Insufficient documentation

## 2018-12-28 LAB — POCT URINALYSIS DIPSTICK OB
Blood, UA: NEGATIVE
Glucose, UA: NEGATIVE
KETONES UA: NEGATIVE
Leukocytes, UA: NEGATIVE
Nitrite, UA: NEGATIVE
POC,PROTEIN,UA: NEGATIVE

## 2018-12-28 NOTE — Progress Notes (Signed)
Patient ID: Crystal Gould, female   DOB: Mar 05, 1991, 28 y.o.   MRN: [id number]    LOW-RISK PREGNANCY VISIT Patient name: Crystal Gould MRN [id number]  Date of birth: 1991-02-24 Chief Complaint:   No chief complaint on file.  History of Present Illness:   Crystal Gould is a 28 y.o. [id number] female at [redacted]w[redacted]d with an Estimated Date of Delivery: 07/05/19 being seen today for ongoing management of a low-risk pregnancy.  Wants to know gender but daughter and best friend will have gender reveal party for her.. Did materni21 testing with this pregnancy. Plans for Implanon after delivery. Today she reports no complaints.  .  .   . denies leaking of fluid. Review of Systems:   Pertinent items are noted in HPI Denies abnormal vaginal discharge w/ itching/odor/irritation, headaches, visual changes, shortness of breath, chest pain, abdominal pain, severe nausea/vomiting, or problems with urination or bowel movements unless otherwise stated above. Pertinent History Reviewed:  Reviewed past medical,surgical, social, obstetrical and family history.  Reviewed problem list, medications and allergies. Physical Assessment:   Vitals:   12/28/18 1124  BP: 109/65  Pulse: 90  Weight: 139 lb 6.4 oz (63.2 kg)  Body mass index is 25.5 kg/m.        Physical Examination:   General appearance: Well appearing, and in no distress  Mental status: Alert, oriented to person, place, and time  Skin: Warm & dry  Cardiovascular: Normal heart rate noted  Respiratory: Normal respiratory effort, no distress  Abdomen: Soft, gravid, nontender  Pelvic: Cervical exam deferred         Extremities: Edema: None  Fetal Status:          No results found for this or any previous visit (from the past 24 hour(s)).  Assessment & Plan:  1) Low-risk pregnancy [id number] at [redacted]w[redacted]d with an Estimated Date of Delivery: 07/05/19   2) Nexplanon discussed,    Meds: No orders of the defined types were placed in this encounter.   Labs/procedures today: [place] 13 wks,measurements c/w dates,normal ovaries bilat,anterior placenta gr 0,crl 67.12 mm,NB present,NT 1.5 mm,fhr 164 bpm  Plan:   Continue routine obstetrical care  4 weeks 2nd ITS  5 weeks growth u/s: growth    Follow-up: No follow-ups on file.  No orders of the defined types were placed in this encounter.  By signing my name below, I, Arnette Norris, attest that this documentation has been prepared under the direction and in the presence of Tilda Burrow, MD. Electronically Signed: Arnette Norris Medical Scribe. 12/28/18. 11:28 AM.  I personally performed the services described in this documentation, which was SCRIBED in my presence. The recorded information has been reviewed and considered accurate. It has been edited as necessary during review. Tilda Burrow, MD

## 2018-12-28 NOTE — Patient Instructions (Signed)
Endometrial Ablation  Endometrial ablation is a procedure that destroys the thin inner layer of the lining of the uterus (endometrium). This procedure may be done:  · To stop heavy periods.  · To stop bleeding that is causing anemia.  · To control irregular bleeding.  · To treat bleeding caused by small tumors (fibroids) in the endometrium.  This procedure is often an alternative to major surgery, such as removal of the uterus and cervix (hysterectomy). As a result of this procedure:  · You may not be able to have children. However, if you are premenopausal (you have not gone through menopause):  ? You may still have a small chance of getting pregnant.  ? You will need to use a reliable method of birth control after the procedure to prevent pregnancy.  · You may stop having a menstrual period, or you may have only a small amount of bleeding during your period. Menstruation may return several years after the procedure.  Tell a health care provider about:  · Any allergies you have.  · All medicines you are taking, including vitamins, herbs, eye drops, creams, and over-the-counter medicines.  · Any problems you or family members have had with the use of anesthetic medicines.  · Any blood disorders you have.  · Any surgeries you have had.  · Any medical conditions you have.  What are the risks?  Generally, this is a safe procedure. However, problems may occur, including:  · A hole (perforation) in the uterus or bowel.  · Infection of the uterus, bladder, or vagina.  · Bleeding.  · Damage to other structures or organs.  · An air bubble in the lung (air embolus).  · Problems with pregnancy after the procedure.  · Failure of the procedure.  · Decreased ability to diagnose cancer in the endometrium.  What happens before the procedure?  · You will have tests of your endometrium to make sure there are no pre-cancerous cells or cancer cells present.  · You may have an ultrasound of the uterus.  · You may be given medicines to  thin the endometrium.  · Ask your health care provider about:  ? Changing or stopping your regular medicines. This is especially important if you take diabetes medicines or blood thinners.  ? Taking medicines such as aspirin and ibuprofen. These medicines can thin your blood. Do not take these medicines before your procedure if your doctor tells you not to.  · Plan to have someone take you home from the hospital or clinic.  What happens during the procedure?    · You will lie on an exam table with your feet and legs supported as in a pelvic exam.  · To lower your risk of infection:  ? Your health care team will wash or sanitize their hands and put on germ-free (sterile) gloves.  ? Your genital area will be washed with soap.  · An IV tube will be inserted into one of your veins.  · You will be given a medicine to help you relax (sedative).  · A surgical instrument with a light and camera (resectoscope) will be inserted into your vagina and moved into your uterus. This allows your surgeon to see inside your uterus.  · Endometrial tissue will be removed using one of the following methods:  ? Radiofrequency. This method uses a radiofrequency-alternating electric current to remove the endometrium.  ? Cryotherapy. This method uses extreme cold to freeze the endometrium.  ? Heated-free liquid. This   method uses a heated saltwater (saline) solution to remove the endometrium.  ? Microwave. This method uses high-energy microwaves to heat up the endometrium and remove it.  ? Thermal balloon. This method involves inserting a catheter with a balloon tip into the uterus. The balloon tip is filled with heated fluid to remove the endometrium.  The procedure may vary among health care providers and hospitals.  What happens after the procedure?  · Your blood pressure, heart rate, breathing rate, and blood oxygen level will be monitored until the medicines you were given have worn off.  · As tissue healing occurs, you may notice  vaginal bleeding for 4-6 weeks after the procedure. You may also experience:  ? Cramps.  ? Thin, watery vaginal discharge that is light pink or brown in color.  ? A need to urinate more frequently than usual.  ? Nausea.  · Do not drive for 24 hours if you were given a sedative.  · Do not have sex or insert anything into your vagina until your health care provider approves.  Summary  · Endometrial ablation is done to treat the many causes of heavy menstrual bleeding.  · The procedure may be done only after medications have been tried to control the bleeding.  · Plan to have someone take you home from the hospital or clinic.  This information is not intended to replace advice given to you by your health care provider. Make sure you discuss any questions you have with your health care provider.  Document Released: 08/14/2004 Document Revised: 03/22/2018 Document Reviewed: 10/22/2016  Elsevier Interactive Patient Education © 2019 [employer]

## 2018-12-28 NOTE — Progress Notes (Signed)
[place] 13 wks,measurements c/w dates,normal ovaries bilat,anterior placenta gr 0,crl 67.12 mm,NB present,NT 1.5 mm,fhr 164 bpm

## 2018-12-30 LAB — INTEGRATED 1
CROWN RUMP LENGTH MAT SCREEN: 67.2 mm
Gest. Age on Collection Date: 12.9 weeks
Maternal Age at EDD: 28.9 yr
NUCHAL TRANSLUCENCY (NT): 1.5 mm
Number of Fetuses: 1
PAPP-A Value: 3046.1 ng/mL
Weight: 139 [lb_av]

## 2019-01-24 ENCOUNTER — Telehealth: Payer: Self-pay | Admitting: *Deleted

## 2019-01-24 NOTE — Telephone Encounter (Signed)
LMOVM to return our call in regards to appt tomorrow

## 2019-01-24 NOTE — Telephone Encounter (Signed)
Patient informed that we are not allowing visitors or children to come to appointments at this time. Patient denies any contact with anyone suspected or confirmed of having COVID-19. Pt denies fever, cough, sob, muscle pain, diarrhea, rash, vomiting, abdominal pain, red eye, weakness, bruising or bleeding, joint pain or severe headache.  

## 2019-01-25 ENCOUNTER — Ambulatory Visit (INDEPENDENT_AMBULATORY_CARE_PROVIDER_SITE_OTHER): Payer: Medicaid Other | Admitting: Advanced Practice Midwife

## 2019-01-25 ENCOUNTER — Other Ambulatory Visit: Payer: Self-pay

## 2019-01-25 ENCOUNTER — Encounter: Payer: Self-pay | Admitting: Advanced Practice Midwife

## 2019-01-25 VITALS — BP 109/62 | HR 86 | Temp 99.0°F | Wt 142.5 lb

## 2019-01-25 DIAGNOSIS — Z331 Pregnant state, incidental: Secondary | ICD-10-CM

## 2019-01-25 DIAGNOSIS — Z3A17 17 weeks gestation of pregnancy: Secondary | ICD-10-CM

## 2019-01-25 DIAGNOSIS — Z1389 Encounter for screening for other disorder: Secondary | ICD-10-CM

## 2019-01-25 DIAGNOSIS — Z363 Encounter for antenatal screening for malformations: Secondary | ICD-10-CM

## 2019-01-25 DIAGNOSIS — Z3482 Encounter for supervision of other normal pregnancy, second trimester: Secondary | ICD-10-CM

## 2019-01-25 DIAGNOSIS — A749 Chlamydial infection, unspecified: Secondary | ICD-10-CM

## 2019-01-25 DIAGNOSIS — Z1379 Encounter for other screening for genetic and chromosomal anomalies: Secondary | ICD-10-CM

## 2019-01-25 NOTE — Progress Notes (Signed)
   LOW-RISK PREGNANCY VISIT Patient name: Crystal Gould MRN [id number]  Date of birth: [birth year] Chief Complaint:   Routine Prenatal Visit (2nd IT)  History of Present Illness:   Crystal Gould is a 28 y.o. [id number] female at [redacted]w[redacted]d with an Estimated Date of Delivery: 07/05/19 being seen today for ongoing management of a low-risk pregnancy.  Today she reports no complaints. Contractions: Not present. Vag. Bleeding: None.   . denies leaking of fluid. Review of Systems:   Pertinent items are noted in HPI Denies abnormal vaginal discharge w/ itching/odor/irritation, headaches, visual changes, shortness of breath, chest pain, abdominal pain, severe nausea/vomiting, or problems with urination or bowel movements unless otherwise stated above.  Pertinent History Reviewed:  Medical & Surgical Hx:   Past Medical History:  Diagnosis Date  . Medical history non-contributory    Past Surgical History:  Procedure Laterality Date  . NO PAST SURGERIES     Family History  Problem Relation Age of Onset  . Cancer Paternal Grandmother   . Diabetes Maternal Grandmother   . Hypertension Maternal Grandmother     Current Outpatient Medications:  .  prenatal vitamin w/FE, FA (PRENATAL 1 + 1) 27-1 MG TABS tablet, Take 1 tablet by mouth daily at 12 noon., Disp: 30 each, Rfl: 12 Social History: Reviewed -  reports that she has been smoking cigarettes. She has never used smokeless tobacco.  Physical Assessment:   Vitals:   01/25/19 1052  BP: 109/62  Pulse: 86  Temp: 99 F (37.2 C)  Weight: 142 lb 8 oz (64.6 kg)  Body mass index is 26.06 kg/m.        Physical Examination:   General appearance: Well appearing, and in no distress  Mental status: Alert, oriented to person, place, and time  Skin: Warm & dry  Cardiovascular: Normal heart rate noted  Respiratory: Normal respiratory effort, no distress  Abdomen: Soft, gravid, nontender  Pelvic: Cervical exam deferred         Extremities: Edema: None   Fetal Status: Fetal Heart Rate (bpm): 150        No results found for this or any previous visit (from the past 24 hour(s)).  Assessment & Plan:  1) Low-risk pregnancy [id number] at [redacted]w[redacted]d with an Estimated Date of Delivery: 07/05/19   2) CHL, treated   Labs/procedures/US today: POC CHL; 2nd IT  Plan:  Continue routine obstetrical care    Follow-up: Return in about 3 weeks (around 02/15/2019) for LROB, [id number].  Orders Placed This Encounter  Procedures  . GC/Chlamydia Probe Amp  . US OB Comp + 14 Wk  . INTEGRATED 2  . POC Urinalysis Dipstick OB   Jacklyn Shell CNM 01/25/2019 11:06 AM

## 2019-01-25 NOTE — Patient Instructions (Signed)
Crystal Gould, I greatly value your feedback.  If you receive a survey following your visit with [place] today, we appreciate you taking the time to fill it out.  Thanks, [name], CNM     Second Trimester of Pregnancy The second trimester is from week 14 through week 27 (months 4 through 6). The second trimester is often a time when you feel your best. Your body has adjusted to being pregnant, and you begin to feel better physically. Usually, morning sickness has lessened or quit completely, you may have more energy, and you may have an increase in appetite. The second trimester is also a time when the fetus is growing rapidly. At the end of the sixth month, the fetus is about 9 inches long and weighs about 1 pounds. You will likely begin to feel the baby move (quickening) between 16 and 20 weeks of pregnancy. Body changes during your second trimester Your body continues to go through many changes during your second trimester. The changes vary from woman to woman.  Your weight will continue to increase. You will notice your lower abdomen bulging out.  You may begin to get stretch marks on your hips, abdomen, and breasts.  You may develop headaches that can be relieved by medicines. The medicines should be approved by your health care provider.  You may urinate more often because the fetus is pressing on your bladder.  You may develop or continue to have heartburn as a result of your pregnancy.  You may develop constipation because certain hormones are causing the muscles that push waste through your intestines to slow down.  You may develop hemorrhoids or swollen, bulging veins (varicose veins).  You may have back pain. This is caused by: ? Weight gain. ? Pregnancy hormones that are relaxing the joints in your pelvis. ? A shift in weight and the muscles that support your balance.  Your breasts will continue to grow and they will continue to become tender.  Your gums may  bleed and may be sensitive to brushing and flossing.  Dark spots or blotches (chloasma, mask of pregnancy) may develop on your face. This will likely fade after the baby is born.  A dark line from your belly button to the pubic area (linea nigra) may appear. This will likely fade after the baby is born.  You may have changes in your hair. These can include thickening of your hair, rapid growth, and changes in texture. Some women also have hair loss during or after pregnancy, or hair that feels dry or thin. Your hair will most likely return to normal after your baby is born.  What to expect at prenatal visits During a routine prenatal visit:  You will be weighed to make sure you and the fetus are growing normally.  Your blood pressure will be taken.  Your abdomen will be measured to track your baby's growth.  The fetal heartbeat will be listened to.  Any test results from the previous visit will be discussed.  Your health care provider may ask you:  How you are feeling.  If you are feeling the baby move.  If you have had any abnormal symptoms, such as leaking fluid, bleeding, severe headaches, or abdominal cramping.  If you are using any tobacco products, including cigarettes, chewing tobacco, and electronic cigarettes.  If you have any questions.  Other tests that may be performed during your second trimester include:  Blood tests that check for: ? Low iron levels (anemia). ?  High blood sugar that affects pregnant women (gestational diabetes) between 73 and 28 weeks. ? Rh antibodies. This is to check for a protein on red blood cells (Rh factor).  Urine tests to check for infections, diabetes, or protein in the urine.  An ultrasound to confirm the proper growth and development of the baby.  An amniocentesis to check for possible genetic problems.  Fetal screens for spina bifida and Down syndrome.  HIV (human immunodeficiency virus) testing. Routine prenatal testing  includes screening for HIV, unless you choose not to have this test.  Follow these instructions at home: Medicines  Follow your health care provider's instructions regarding medicine use. Specific medicines may be either safe or unsafe to take during pregnancy.  Take a prenatal vitamin that contains at least 600 micrograms (mcg) of folic acid.  If you develop constipation, try taking a stool softener if your health care provider approves. Eating and drinking  Eat a balanced diet that includes fresh fruits and vegetables, whole grains, good sources of protein such as meat, eggs, or tofu, and low-fat dairy. Your health care provider will help you determine the amount of weight gain that is right for you.  Avoid raw meat and uncooked cheese. These carry germs that can cause birth defects in the baby.  If you have low calcium intake from food, talk to your health care provider about whether you should take a daily calcium supplement.  Limit foods that are high in fat and processed sugars, such as fried and sweet foods.  To prevent constipation: ? Drink enough fluid to keep your urine clear or pale yellow. ? Eat foods that are high in fiber, such as fresh fruits and vegetables, whole grains, and beans. Activity  Exercise only as directed by your health care provider. Most women can continue their usual exercise routine during pregnancy. Try to exercise for 30 minutes at least 5 days a week. Stop exercising if you experience uterine contractions.  Avoid heavy lifting, wear low heel shoes, and practice good posture.  A sexual relationship may be continued unless your health care provider directs you otherwise. Relieving pain and discomfort  Wear a good support bra to prevent discomfort from breast tenderness.  Take warm sitz baths to soothe any pain or discomfort caused by hemorrhoids. Use hemorrhoid cream if your health care provider approves.  Rest with your legs elevated if you have  leg cramps or low back pain.  If you develop varicose veins, wear support hose. Elevate your feet for 15 minutes, 3-4 times a day. Limit salt in your diet. Prenatal Care  Write down your questions. Take them to your prenatal visits.  Keep all your prenatal visits as told by your health care provider. This is important. Safety  Wear your seat belt at all times when driving.  Make a list of emergency phone numbers, including numbers for family, friends, the hospital, and police and fire departments. General instructions  Ask your health care provider for a referral to a local prenatal education class. Begin classes no later than the beginning of month 6 of your pregnancy.  Ask for help if you have counseling or nutritional needs during pregnancy. Your health care provider can offer advice or refer you to specialists for help with various needs.  Do not use hot tubs, steam rooms, or saunas.  Do not douche or use tampons or scented sanitary pads.  Do not cross your legs for long periods of time.  Avoid cat litter boxes and  soil used by cats. These carry germs that can cause birth defects in the baby and possibly loss of the fetus by miscarriage or stillbirth.  Avoid all smoking, herbs, alcohol, and unprescribed drugs. Chemicals in these products can affect the formation and growth of the baby.  Do not use any products that contain nicotine or tobacco, such as cigarettes and e-cigarettes. If you need help quitting, ask your health care provider.  Visit your dentist if you have not gone yet during your pregnancy. Use a soft toothbrush to brush your teeth and be gentle when you floss. Contact a health care provider if:  You have dizziness.  You have mild pelvic cramps, pelvic pressure, or nagging pain in the abdominal area.  You have persistent nausea, vomiting, or diarrhea.  You have a bad smelling vaginal discharge.  You have pain when you urinate. Get help right away if:  You  have a fever.  You are leaking fluid from your vagina.  You have spotting or bleeding from your vagina.  You have severe abdominal cramping or pain.  You have rapid weight gain or weight loss.  You have shortness of breath with chest pain.  You notice sudden or extreme swelling of your face, hands, ankles, feet, or legs.  You have not felt your baby move in over an hour.  You have severe headaches that do not go away when you take medicine.  You have vision changes. Summary  The second trimester is from week 14 through week 27 (months 4 through 6). It is also a time when the fetus is growing rapidly.  Your body goes through many changes during pregnancy. The changes vary from woman to woman.  Avoid all smoking, herbs, alcohol, and unprescribed drugs. These chemicals affect the formation and growth your baby.  Do not use any tobacco products, such as cigarettes, chewing tobacco, and e-cigarettes. If you need help quitting, ask your health care provider.  Contact your health care provider if you have any questions. Keep all prenatal visits as told by your health care provider. This is important. This information is not intended to replace advice given to you by your health care provider. Make sure you discuss any questions you have with your health care provider.      CHILDBIRTH CLASSES [phone] is the phone number for Pregnancy Classes or hospital tours at [place] will be referred to  [email or web address] for more information on childbirth classes  At this site you may register for classes. You may sign up for a waiting list if classes are full. Please SIGN UP FOR THIS!.   When the waiting list becomes long, sometimes new classes can be added.

## 2019-01-26 LAB — GC/CHLAMYDIA PROBE AMP
Chlamydia trachomatis, NAA: NEGATIVE
Neisseria Gonorrhoeae by PCR: NEGATIVE

## 2019-01-27 LAB — INTEGRATED 2
AFP MoM: 1.32
Alpha-Fetoprotein: 52.4 ng/mL
Crown Rump Length: 67.2 mm
DIA MoM: 1.75
DIA Value: 294 pg/mL
Estriol, Unconjugated: 1.11 ng/mL
Gest. Age on Collection Date: 12.9 weeks
Gestational Age: 16.9 weeks
Maternal Age at EDD: 28.9 yr
Nuchal Translucency (NT): 1.5 mm
Nuchal Translucency MoM: 0.99
Number of Fetuses: 1
PAPP-A MoM: 2.56
PAPP-A Value: 3046.1 ng/mL
Test Results:: NEGATIVE
Weight: 139 [lb_av]
Weight: 143 [lb_av]
hCG MoM: 0.92
hCG Value: 28.5 IU/mL
uE3 MoM: 1.05

## 2019-02-15 ENCOUNTER — Other Ambulatory Visit: Payer: Self-pay

## 2019-02-15 ENCOUNTER — Ambulatory Visit (INDEPENDENT_AMBULATORY_CARE_PROVIDER_SITE_OTHER): Payer: Medicaid Other

## 2019-02-15 ENCOUNTER — Ambulatory Visit (INDEPENDENT_AMBULATORY_CARE_PROVIDER_SITE_OTHER): Payer: Medicaid Other | Admitting: Women's Health

## 2019-02-15 ENCOUNTER — Encounter: Payer: Self-pay | Admitting: Women's Health

## 2019-02-15 VITALS — BP 103/61 | HR 87 | Temp 98.8°F | Wt 144.5 lb

## 2019-02-15 DIAGNOSIS — Z3A20 20 weeks gestation of pregnancy: Secondary | ICD-10-CM

## 2019-02-15 DIAGNOSIS — Z3482 Encounter for supervision of other normal pregnancy, second trimester: Secondary | ICD-10-CM

## 2019-02-15 DIAGNOSIS — Z3402 Encounter for supervision of normal first pregnancy, second trimester: Secondary | ICD-10-CM

## 2019-02-15 DIAGNOSIS — Z331 Pregnant state, incidental: Secondary | ICD-10-CM

## 2019-02-15 DIAGNOSIS — Z363 Encounter for antenatal screening for malformations: Secondary | ICD-10-CM

## 2019-02-15 DIAGNOSIS — Z1389 Encounter for screening for other disorder: Secondary | ICD-10-CM

## 2019-02-15 DIAGNOSIS — A749 Chlamydial infection, unspecified: Secondary | ICD-10-CM

## 2019-02-15 LAB — POCT URINALYSIS DIPSTICK OB
Blood, UA: NEGATIVE
Glucose, UA: NEGATIVE
Glucose, UA: NEGATIVE
Ketones, UA: NEGATIVE
Nitrite, UA: NEGATIVE
POC,PROTEIN,UA: NEGATIVE

## 2019-02-15 NOTE — Patient Instructions (Signed)
Crystal Gould, I greatly value your feedback.  If you receive a survey following your visit with [place] today, we appreciate you taking the time to fill it out.  Thanks, Joellyn Haff, CNM, [facility]  [facility] HAS MOVED!!! It is now [facility] ([address] [place], [place] [address]) Entrance located off of [address] Free 24/7 valet parking   Home Blood Pressure Monitoring for Patients   Your provider has recommended that you check your blood pressure (BP) at least once a week at home. If you do not have a blood pressure cuff at home, one will be provided for you. Contact your provider if you have not received your monitor within 1 week.   Helpful Tips for Accurate Home Blood Pressure Checks  . Don't smoke, exercise, or drink caffeine 30 minutes before checking your BP . Use the restroom before checking your BP (a full bladder can raise your pressure) . Relax in a comfortable upright chair . Feet on the ground . Left arm resting comfortably on a flat surface at the level of your heart . Legs uncrossed . Back supported . Sit quietly and don't talk . Place the cuff on your bare arm . Adjust snuggly, so that only two fingertips can fit between your skin and the top of the cuff . Check 2 readings separated by at least one minute . Keep a log of your BP readings . For a visual, please reference this diagram: http://ccnc.care/bpdiagram  Provider Name: [facility]     Phone: [phone]  Zone 1: ALL CLEAR  Continue to monitor your symptoms:  . BP reading is less than 140 (top number) or less than 90 (bottom number)  . No right upper stomach pain . No headaches or seeing spots . No feeling nauseated or throwing up . No swelling in face and hands  Zone 2: CAUTION Call your doctor's office for any of the following:  . BP reading is greater than 140 (top number) or greater than 90 (bottom number)  . Stomach pain under your ribs in the middle  or right side . Headaches or seeing spots . Feeling nauseated or throwing up . Swelling in face and hands  Zone 3: EMERGENCY  Seek immediate medical care if you have any of the following:  . BP reading is greater than160 (top number) or greater than 110 (bottom number) . Severe headaches not improving with Tylenol . Serious difficulty catching your breath . Any worsening symptoms from Zone 2      Second Trimester of Pregnancy The second trimester is from week 14 through week 27 (months 4 through 6). The second trimester is often a time when you feel your best. Your body has adjusted to being pregnant, and you begin to feel better physically. Usually, morning sickness has lessened or quit completely, you may have more energy, and you may have an increase in appetite. The second trimester is also a time when the fetus is growing rapidly. At the end of the sixth month, the fetus is about 9 inches long and weighs about 1 pounds. You will likely begin to feel the baby move (quickening) between 16 and 20 weeks of pregnancy. Body changes during your second trimester Your body continues to go through many changes during your second trimester. The changes vary from woman to woman.  Your weight will continue to increase. You will notice your lower abdomen bulging out.  You may begin to  get stretch marks on your hips, abdomen, and breasts.  You may develop headaches that can be relieved by medicines. The medicines should be approved by your health care provider.  You may urinate more often because the fetus is pressing on your bladder.  You may develop or continue to have heartburn as a result of your pregnancy.  You may develop constipation because certain hormones are causing the muscles that push waste through your intestines to slow down.  You may develop hemorrhoids or swollen, bulging veins (varicose veins).  You may have back pain. This is caused by: ? Weight gain. ? Pregnancy  hormones that are relaxing the joints in your pelvis. ? A shift in weight and the muscles that support your balance.  Your breasts will continue to grow and they will continue to become tender.  Your gums may bleed and may be sensitive to brushing and flossing.  Dark spots or blotches (chloasma, mask of pregnancy) may develop on your face. This will likely fade after the baby is born.  A dark line from your belly button to the pubic area (linea nigra) may appear. This will likely fade after the baby is born.  You may have changes in your hair. These can include thickening of your hair, rapid growth, and changes in texture. Some women also have hair loss during or after pregnancy, or hair that feels dry or thin. Your hair will most likely return to normal after your baby is born.  What to expect at prenatal visits During a routine prenatal visit:  You will be weighed to make sure you and the fetus are growing normally.  Your blood pressure will be taken.  Your abdomen will be measured to track your baby's growth.  The fetal heartbeat will be listened to.  Any test results from the previous visit will be discussed.  Your health care provider may ask you:  How you are feeling.  If you are feeling the baby move.  If you have had any abnormal symptoms, such as leaking fluid, bleeding, severe headaches, or abdominal cramping.  If you are using any tobacco products, including cigarettes, chewing tobacco, and electronic cigarettes.  If you have any questions.  Other tests that may be performed during your second trimester include:  Blood tests that check for: ? Low iron levels (anemia). ? High blood sugar that affects pregnant women (gestational diabetes) between 68 and 28 weeks. ? Rh antibodies. This is to check for a protein on red blood cells (Rh factor).  Urine tests to check for infections, diabetes, or protein in the urine.  An ultrasound to confirm the proper growth and  development of the baby.  An amniocentesis to check for possible genetic problems.  Fetal screens for spina bifida and Down syndrome.  HIV (human immunodeficiency virus) testing. Routine prenatal testing includes screening for HIV, unless you choose not to have this test.  Follow these instructions at home: Medicines  Follow your health care provider's instructions regarding medicine use. Specific medicines may be either safe or unsafe to take during pregnancy.  Take a prenatal vitamin that contains at least 600 micrograms (mcg) of folic acid.  If you develop constipation, try taking a stool softener if your health care provider approves. Eating and drinking  Eat a balanced diet that includes fresh fruits and vegetables, whole grains, good sources of protein such as meat, eggs, or tofu, and low-fat dairy. Your health care provider will help you determine the amount of weight gain  that is right for you.  Avoid raw meat and uncooked cheese. These carry germs that can cause birth defects in the baby.  If you have low calcium intake from food, talk to your health care provider about whether you should take a daily calcium supplement.  Limit foods that are high in fat and processed sugars, such as fried and sweet foods.  To prevent constipation: ? Drink enough fluid to keep your urine clear or pale yellow. ? Eat foods that are high in fiber, such as fresh fruits and vegetables, whole grains, and beans. Activity  Exercise only as directed by your health care provider. Most women can continue their usual exercise routine during pregnancy. Try to exercise for 30 minutes at least 5 days a week. Stop exercising if you experience uterine contractions.  Avoid heavy lifting, wear low heel shoes, and practice good posture.  A sexual relationship may be continued unless your health care provider directs you otherwise. Relieving pain and discomfort  Wear a good support bra to prevent discomfort  from breast tenderness.  Take warm sitz baths to soothe any pain or discomfort caused by hemorrhoids. Use hemorrhoid cream if your health care provider approves.  Rest with your legs elevated if you have leg cramps or low back pain.  If you develop varicose veins, wear support hose. Elevate your feet for 15 minutes, 3-4 times a day. Limit salt in your diet. Prenatal Care  Write down your questions. Take them to your prenatal visits.  Keep all your prenatal visits as told by your health care provider. This is important. Safety  Wear your seat belt at all times when driving.  Make a list of emergency phone numbers, including numbers for family, friends, the hospital, and police and fire departments. General instructions  Ask your health care provider for a referral to a local prenatal education class. Begin classes no later than the beginning of month 6 of your pregnancy.  Ask for help if you have counseling or nutritional needs during pregnancy. Your health care provider can offer advice or refer you to specialists for help with various needs.  Do not use hot tubs, steam rooms, or saunas.  Do not douche or use tampons or scented sanitary pads.  Do not cross your legs for long periods of time.  Avoid cat litter boxes and soil used by cats. These carry germs that can cause birth defects in the baby and possibly loss of the fetus by miscarriage or stillbirth.  Avoid all smoking, herbs, alcohol, and unprescribed drugs. Chemicals in these products can affect the formation and growth of the baby.  Do not use any products that contain nicotine or tobacco, such as cigarettes and e-cigarettes. If you need help quitting, ask your health care provider.  Visit your dentist if you have not gone yet during your pregnancy. Use a soft toothbrush to brush your teeth and be gentle when you floss. Contact a health care provider if:  You have dizziness.  You have mild pelvic cramps, pelvic  pressure, or nagging pain in the abdominal area.  You have persistent nausea, vomiting, or diarrhea.  You have a bad smelling vaginal discharge.  You have pain when you urinate. Get help right away if:  You have a fever.  You are leaking fluid from your vagina.  You have spotting or bleeding from your vagina.  You have severe abdominal cramping or pain.  You have rapid weight gain or weight loss.  You have shortness of  breath with chest pain.  You notice sudden or extreme swelling of your face, hands, ankles, feet, or legs.  You have not felt your baby move in over an hour.  You have severe headaches that do not go away when you take medicine.  You have vision changes. Summary  The second trimester is from week 14 through week 27 (months 4 through 6). It is also a time when the fetus is growing rapidly.  Your body goes through many changes during pregnancy. The changes vary from woman to woman.  Avoid all smoking, herbs, alcohol, and unprescribed drugs. These chemicals affect the formation and growth your baby.  Do not use any tobacco products, such as cigarettes, chewing tobacco, and e-cigarettes. If you need help quitting, ask your health care provider.  Contact your health care provider if you have any questions. Keep all prenatal visits as told by your health care provider. This is important. This information is not intended to replace advice given to you by your health care provider. Make sure you discuss any questions you have with your health care provider. Document Released: 09/29/2001 Document Revised: 03/12/2016 Document Reviewed: 12/06/2012 Elsevier Interactive Patient Education  2017 [place] (COVID-19) Are you at risk?  Are you at risk for the Coronavirus (COVID-19)?  To be considered HIGH RISK for Coronavirus (COVID-19), you have to meet the following criteria:  . Traveled to [place], [place], [place], [place] or [place]; or in the [place] to [place], [place], [place], or [place]; and have fever, cough, and shortness of breath within the last 2 weeks of travel OR . Been in close contact with a person diagnosed with COVID-19 within the last 2 weeks and have fever, cough, and shortness of breath . IF YOU DO NOT MEET THESE CRITERIA, YOU ARE CONSIDERED LOW RISK FOR COVID-19.  What to do if you are HIGH RISK for COVID-19?  Marland Kitchen If you are having a medical emergency, call 911. . Seek medical care right away. Before you go to a doctor's office, urgent care or emergency department, call ahead and tell them about your recent travel, contact with someone diagnosed with COVID-19, and your symptoms. You should receive instructions from your physician's office regarding next steps of care.  . When you arrive at healthcare provider, tell the healthcare staff immediately you have returned from visiting [place], [place], [place], [place] or [place]; or traveled in the [place] to [place], [place], [place], or [place]; in the last two weeks or you have been in close contact with a person diagnosed with COVID-19 in the last 2 weeks.   . Tell the health care staff about your symptoms: fever, cough and shortness of breath. . After you have been seen by a medical provider, you will be either: o Tested for (COVID-19) and discharged home on quarantine except to seek medical care if symptoms worsen, and asked to  - Stay home and avoid contact with others until you get your results (4-5 days)  - Avoid travel on public transportation if possible (such as bus, train, or airplane) or o Sent to the Emergency Department by EMS for evaluation, COVID-19 testing, and possible admission depending on your condition and test results.  What to do if you are LOW RISK for COVID-19?  Reduce your risk of any infection by using the same precautions used for avoiding the common cold or flu:  Marland Kitchen Wash your hands often with soap and warm water  for at  least 20 seconds.  If soap and water are not readily available, use an alcohol-based hand sanitizer with at least 60% alcohol.  . If coughing or sneezing, cover your mouth and nose by coughing or sneezing into the elbow areas of your shirt or coat, into a tissue or into your sleeve (not your hands). . Avoid shaking hands with others and consider head nods or verbal greetings only. . Avoid touching your eyes, nose, or mouth with unwashed hands.  . Avoid close contact with people who are sick. . Avoid places or events with large numbers of people in one location, like concerts or sporting events. . Carefully consider travel plans you have or are making. . If you are planning any travel outside or inside the [place], visit the CDC's Travelers' Health webpage for the latest health notices. . If you have some symptoms but not all symptoms, continue to monitor at home and seek medical attention if your symptoms worsen. . If you are having a medical emergency, call 911.   [place] / e-Visit: [email or web address]         [facility]: [facility]: [phone]                   [facility]: [phone]

## 2019-02-15 NOTE — Progress Notes (Signed)
TELEHEALTH VIRTUAL OBSTETRICS VISIT ENCOUNTER NOTE Patient name: Crystal Gould MRN [id number]  Date of birth: 11/16/1990  I connected with patient on 02/15/19 at 12:00 PM EDT by [facility] and verified that I am speaking with the correct person using two identifiers. Due to COVID-19 recommendations, pt is not currently in our office. Was here earlier for anatomy u/s.    I discussed the limitations, risks, security and privacy concerns of performing an evaluation and management service by telephone and the availability of in person appointments. I also discussed with the patient that there may be a patient responsible charge related to this service. The patient expressed understanding and agreed to proceed.  Chief Complaint:   Routine Prenatal Visit (US today)  History of Present Illness:   Crystal Gould is a 28 y.o. [phone] female at 521w0d with an Estimated Date of Delivery: 07/05/19 being evaluated today for ongoing management of a low-risk pregnancy.  Today she reports no complaints. Contractions: Not present. Vag. Bleeding: None.  Movement: Present. denies leaking of fluid. Review of Systems:   Pertinent items are noted in HPI Denies abnormal vaginal discharge w/ itching/odor/irritation, headaches, visual changes, shortness of breath, chest pain, abdominal pain, severe nausea/vomiting, or problems with urination or bowel movements unless otherwise stated above. Pertinent History Reviewed:  Reviewed past medical,surgical, social, obstetrical and family history.  Reviewed problem list, medications and allergies. Physical Assessment:   Vitals:   02/15/19 1209  BP: 103/61  Pulse: 87  Temp: 98.8 F (37.1 C)  Weight: 144 lb 8 oz (65.5 kg)  Body mass index is 26.43 kg/m.        Physical Examination:   General:  Alert, oriented and cooperative.   Mental Status: Normal mood and affect perceived. Normal judgment and thought content.  Rest of physical exam deferred due to type of encounter   Today's anatomy US 20 wks,cephalic,cx 3.9 cm,anterior placenta gr 0,normal ovaries bilat,svp of fluid 3.6 cm,fhr 138 bpm,EFW 339 g 57%,anatomy complete,no obvious abnormalities   Results for orders placed or performed in visit on 02/15/19 (from the past 24 hour(s))  POC Urinalysis Dipstick OB   Collection Time: 02/15/19 12:10 PM  Result Value Ref Range   Color, UA     Clarity, UA     Glucose, UA Negative Negative   Bilirubin, UA     Ketones, UA neg    Spec Grav, UA     Blood, UA neg    pH, UA     POC,PROTEIN,UA Negative Negative, Trace, Small (1+), Moderate (2+), Large (3+), 4+   Urobilinogen, UA     Nitrite, UA neg    Leukocytes, UA Trace (A) Negative   Appearance     Odor    Results for orders placed or performed in visit on 12/07/18 (from the past 24 hour(s))  POC Urinalysis Dipstick OB   Collection Time: 02/15/19 12:10 PM  Result Value Ref Range   Color, UA     Clarity, UA     Glucose, UA Negative Negative   Bilirubin, UA     Ketones, UA     Spec Grav, UA     Blood, UA     pH, UA     POC,PROTEIN,UA     Urobilinogen, UA     Nitrite, UA     Leukocytes, UA     Appearance     Odor      Assessment & Plan:  1) Pregnancy [id number] at 621w0d with an Estimated Date of  Delivery: 07/05/19    Meds: No orders of the defined types were placed in this encounter.   Labs/procedures today: anatomy u/s  Plan:  Continue routine obstetrical care. Has home BP cuff. Check bp weekly, let us know if >140/90.   Reviewed: Preterm labor symptoms and general obstetric precautions including but not limited to vaginal bleeding, contractions, leaking of fluid and fetal movement were reviewed in detail with the patient. The patient was advised to call back or seek an in-person office evaluation/go to [facility] at [facility] for any urgent or concerning symptoms. All questions were answered. Please refer to After Visit Summary for other counseling recommendations.   I provided 10  minutes of non-face-to-face time during this encounter.  Follow-up: Return in about 4 weeks (around 03/15/2019) for LROB webex.  Orders Placed This Encounter  Procedures  . POC Urinalysis Dipstick OB   Cheral Marker CNM, [facility] 02/15/2019 1:49 PM

## 2019-02-15 NOTE — Progress Notes (Signed)
[place] 20 wks,cephalic,cx 3.9 cm,anterior placenta gr 0,normal ovaries bilat,svp of fluid 3.6 cm,fhr 138 bpm,EFW 339 g 57%,anatomy complete,no obvious abnormalities

## 2019-03-08 ENCOUNTER — Telehealth: Payer: Self-pay | Admitting: Women's Health

## 2019-03-08 NOTE — Telephone Encounter (Signed)
Pt would like advice on how to quit smoking. She states she has cut back but when she gets upset she smokes. Requesting a nurse to call.

## 2019-03-10 NOTE — Telephone Encounter (Signed)
Attempted to call patient back. She can call [phone]-Quitnow

## 2019-03-15 ENCOUNTER — Other Ambulatory Visit: Payer: Self-pay

## 2019-03-15 ENCOUNTER — Encounter: Payer: Medicaid Other | Admitting: Women's Health

## 2019-03-15 ENCOUNTER — Encounter: Payer: Self-pay | Admitting: Obstetrics and Gynecology

## 2019-03-15 VITALS — Wt 144.0 lb

## 2019-03-16 NOTE — Progress Notes (Signed)
This encounter was created in error - please disregard.

## 2019-03-28 ENCOUNTER — Telehealth: Payer: Self-pay | Admitting: Obstetrics and Gynecology

## 2019-03-28 NOTE — Telephone Encounter (Signed)
Patient states she has some spotting this morning when she woke up.  She has not had any cramping or contractions and baby is active.  No recent intercourse.  Informed patient some early morning spotting is not uncommon and since she is not having any uterine activity or current bleeding, just to continue to monitor.  Pt agreeable to plan and verbalized understanding.  Appt also made for patient since she does not have a future appt.

## 2019-03-28 NOTE — Telephone Encounter (Signed)
Patient called, stated that she just starting seeing blood, bright blood.  Stated that she is 6 months pregnant.  [phone]

## 2019-03-29 ENCOUNTER — Ambulatory Visit (INDEPENDENT_AMBULATORY_CARE_PROVIDER_SITE_OTHER): Payer: Medicaid Other | Admitting: Obstetrics and Gynecology

## 2019-03-29 ENCOUNTER — Encounter: Payer: Self-pay | Admitting: Obstetrics and Gynecology

## 2019-03-29 ENCOUNTER — Other Ambulatory Visit: Payer: Medicaid Other

## 2019-03-29 ENCOUNTER — Other Ambulatory Visit: Payer: Self-pay

## 2019-03-29 VITALS — BP 118/68 | HR 86 | Wt 151.2 lb

## 2019-03-29 DIAGNOSIS — Z3A25 25 weeks gestation of pregnancy: Secondary | ICD-10-CM

## 2019-03-29 DIAGNOSIS — Z3482 Encounter for supervision of other normal pregnancy, second trimester: Secondary | ICD-10-CM

## 2019-03-29 DIAGNOSIS — N760 Acute vaginitis: Secondary | ICD-10-CM

## 2019-03-29 DIAGNOSIS — Z1389 Encounter for screening for other disorder: Secondary | ICD-10-CM

## 2019-03-29 DIAGNOSIS — Z3A26 26 weeks gestation of pregnancy: Secondary | ICD-10-CM

## 2019-03-29 DIAGNOSIS — B9689 Other specified bacterial agents as the cause of diseases classified elsewhere: Secondary | ICD-10-CM

## 2019-03-29 DIAGNOSIS — O23592 Infection of other part of genital tract in pregnancy, second trimester: Secondary | ICD-10-CM | POA: Diagnosis not present

## 2019-03-29 DIAGNOSIS — Z331 Pregnant state, incidental: Secondary | ICD-10-CM

## 2019-03-29 DIAGNOSIS — Z131 Encounter for screening for diabetes mellitus: Secondary | ICD-10-CM

## 2019-03-29 LAB — POCT URINALYSIS DIPSTICK OB
Blood, UA: NEGATIVE
Glucose, UA: NEGATIVE
Ketones, UA: NEGATIVE
Leukocytes, UA: NEGATIVE
Nitrite, UA: NEGATIVE
POC,PROTEIN,UA: NEGATIVE

## 2019-03-29 MED ORDER — METRONIDAZOLE 0.75 % VA GEL: 1.0000 | Freq: Every day | VAGINAL | 1 refills | Status: DC

## 2019-03-29 NOTE — Progress Notes (Signed)
Patient ID: GRACEMARIE SKEET, female   DOB: 09/21/1991, 28 y.o.   MRN: [id number]    LOW-RISK PREGNANCY VISIT Patient name: Crystal Gould MRN [id number]  Date of birth: May 06, 1991 Chief Complaint:   Routine Prenatal Visit (PN2)  History of Present Illness:   Crystal Gould is a 28 y.o. [id number] female at [redacted]w[redacted]d with an Estimated Date of Delivery: 07/05/19 being seen today for ongoing management of a low-risk pregnancy.  Had some spotting 3 days after intercourse as well as some spotting during  Intercourse. Pt called and spoke with Tish yesterday about spotting. Plans for implanon after delivery. Today she reports no complaints. Contractions: Not present.  .  Movement: Present. denies leaking of fluid. Review of Systems:   Pertinent items are noted in HPI Denies abnormal vaginal discharge w/ itching/odor/irritation, headaches, visual changes, shortness of breath, chest pain, abdominal pain, severe nausea/vomiting, or problems with urination or bowel movements unless otherwise stated above. Pertinent History Reviewed:  Reviewed past medical,surgical, social, obstetrical and family history.  Reviewed problem list, medications and allergies. Physical Assessment:   Vitals:   03/29/19 0943  BP: 118/68  Pulse: 86  Weight: 151 lb 3.2 oz (68.6 kg)  Body mass index is 27.65 kg/m.        Physical Examination:   General appearance: Well appearing, and in no distress  Mental status: Alert, oriented to person, place, and time  Skin: Warm & dry  Cardiovascular: Normal heart rate noted  Respiratory: Normal respiratory effort, no distress  Abdomen: Soft, gravid, nontender  Pelvic: Cervical exam performed no blood in vagina, long and closed, soft but does spot after examination with speculum  From cervix contact  Wet Prep: Pos clue, few red cells, no trich.        Extremities: Edema: None  Fetal Status: Fetal Heart Rate (bpm): 139 Fundal Height: 26 cm Movement: Present    Results for orders placed  or performed in visit on 03/29/19 (from the past 24 hour(s))  POC Urinalysis Dipstick OB   Collection Time: 03/29/19  9:46 AM  Result Value Ref Range   Color, UA     Clarity, UA     Glucose, UA Negative Negative   Bilirubin, UA     Ketones, UA neg    Spec Grav, UA     Blood, UA neg    pH, UA     POC,PROTEIN,UA Negative Negative, Trace, Small (1+), Moderate (2+), Large (3+), 4+   Urobilinogen, UA     Nitrite, UA neg    Leukocytes, UA Negative Negative   Appearance     Odor      Assessment & Plan:  1) Low-risk pregnancy [id number] at [redacted]w[redacted]d with an Estimated Date of Delivery: 07/05/19  2) BV,  with cervicitis    Meds: No orders of the defined types were placed in this encounter.  Labs/procedures today: PN2  Plan:   1. Continue routine obstetrical care 2. F/u in 4 weeks with televisit  3. Rx Metrogel   Reviewed: Preterm labor symptoms and general obstetric precautions including but not limited to vaginal bleeding, contractions, leaking of fluid and fetal movement were reviewed in detail with the patient.  All questions were answered  Follow-up: Return in about 3 weeks (around 04/19/2019) for Televisit. 4 week not 3.  Orders Placed This Encounter  Procedures  . POC Urinalysis Dipstick OB   By signing my name below, I, Samul Dada, attest that this documentation has been prepared under the direction and  in the presence of Tilda BurrowFerguson, Shey Yott V, MD. Electronically Signed: Arnette NorrisMari Johnson Medical Scribe. 03/29/19. 10:03 AM.  I personally performed the services described in this documentation, which was SCRIBED in my presence. The recorded information has been reviewed and considered accurate. It has been edited as necessary during review. Tilda BurrowJohn V Kyley Laurel, MD

## 2019-03-30 LAB — CBC
Hematocrit: 28.9 % — ABNORMAL LOW (ref 34.0–46.6)
Hemoglobin: 9.6 g/dL — ABNORMAL LOW (ref 11.1–15.9)
MCH: 26 pg — ABNORMAL LOW (ref 26.6–33.0)
MCHC: 33.2 g/dL (ref 31.5–35.7)
MCV: 78 fL — ABNORMAL LOW (ref 79–97)
Platelets: 171 10*3/uL (ref 150–450)
RBC: 3.69 x10E6/uL — ABNORMAL LOW (ref 3.77–5.28)
RDW: 13.3 % (ref 11.7–15.4)
WBC: 8 10*3/uL (ref 3.4–10.8)

## 2019-03-30 LAB — RPR: RPR Ser Ql: NONREACTIVE

## 2019-03-30 LAB — GLUCOSE TOLERANCE, 2 HOURS W/ 1HR
Glucose, 1 hour: 128 mg/dL (ref 65–179)
Glucose, 2 hour: 115 mg/dL (ref 65–152)
Glucose, Fasting: 77 mg/dL (ref 65–91)

## 2019-03-30 LAB — ANTIBODY SCREEN: Antibody Screen: NEGATIVE

## 2019-03-30 LAB — HIV ANTIBODY (ROUTINE TESTING W REFLEX): HIV Screen 4th Generation wRfx: NONREACTIVE

## 2019-04-19 ENCOUNTER — Other Ambulatory Visit: Payer: Self-pay

## 2019-04-19 ENCOUNTER — Ambulatory Visit (INDEPENDENT_AMBULATORY_CARE_PROVIDER_SITE_OTHER): Payer: Medicaid Other | Admitting: Advanced Practice Midwife

## 2019-04-19 VITALS — BP 107/64 | HR 89 | Wt 151.0 lb

## 2019-04-19 DIAGNOSIS — Z3A29 29 weeks gestation of pregnancy: Secondary | ICD-10-CM

## 2019-04-19 DIAGNOSIS — Z3483 Encounter for supervision of other normal pregnancy, third trimester: Secondary | ICD-10-CM

## 2019-04-19 DIAGNOSIS — Z331 Pregnant state, incidental: Secondary | ICD-10-CM

## 2019-04-19 DIAGNOSIS — Z23 Encounter for immunization: Secondary | ICD-10-CM

## 2019-04-19 DIAGNOSIS — Z1389 Encounter for screening for other disorder: Secondary | ICD-10-CM

## 2019-04-19 LAB — POCT URINALYSIS DIPSTICK OB
Blood, UA: NEGATIVE
Glucose, UA: NEGATIVE
Ketones, UA: NEGATIVE
Leukocytes, UA: NEGATIVE
Nitrite, UA: NEGATIVE
POC,PROTEIN,UA: NEGATIVE

## 2019-04-19 MED ORDER — BLOOD PRESSURE MONITOR AUTOMAT DEVI: 0 refills | Status: DC

## 2019-04-19 NOTE — Progress Notes (Signed)
  [id number] [redacted]w[redacted]d Estimated Date of Delivery: 07/05/19  Blood pressure 107/64, pulse 89, weight 151 lb (68.5 kg), last menstrual period 09/28/2018.   BP weight and urine results all reviewed and noted.  Please refer to the obstetrical flow sheet for the fundal height and fetal heart rate documentation:  Patient reports good fetal movement, denies any bleeding and no rupture of membranes symptoms or regular contractions. Patient is without complaints. All questions were answered.   Physical Assessment:   Vitals:   04/19/19 1451  BP: 107/64  Pulse: 89  Weight: 151 lb (68.5 kg)  Body mass index is 27.62 kg/m.        Physical Examination:   General appearance: Well appearing, and in no distress  Mental status: Alert, oriented to person, place, and time  Skin: Warm & dry  Cardiovascular: Normal heart rate noted  Respiratory: Normal respiratory effort, no distress  Abdomen: Soft, gravid, nontender  Pelvic: Cervical exam deferred         Extremities: Edema: None  Fetal Status: Fetal Heart Rate (bpm): 145 Fundal Height: 29 cm Movement: Present    Results for orders placed or performed in visit on 04/19/19 (from the past 24 hour(s))  POC Urinalysis Dipstick OB   Collection Time: 04/19/19  2:51 PM  Result Value Ref Range   Color, UA     Clarity, UA     Glucose, UA Negative Negative   Bilirubin, UA     Ketones, UA neg    Spec Grav, UA     Blood, UA neg    pH, UA     POC,PROTEIN,UA Negative Negative, Trace, Small (1+), Moderate (2+), Large (3+), 4+   Urobilinogen, UA     Nitrite, UA neg    Leukocytes, UA Negative Negative   Appearance     Odor       Orders Placed This Encounter  Procedures  . Tdap vaccine greater than or equal to 7yo IM  . POC Urinalysis Dipstick OB    Plan:  Continued routine obstetrical care, BP cuff ordered, take BP once a week and let us know if >140/90  Return in about 3 weeks (around 05/10/2019) for LROB web ex.

## 2019-04-19 NOTE — Patient Instructions (Signed)
Crystal Gould, I greatly value your feedback.  If you receive a survey following your visit with [place] today, we appreciate you taking the time to fill it out.  Thanks, Nigel Berthold, CNM   Call the office [phone]) or go to [facility] if:  You begin to have strong, frequent contractions  Your water breaks.  Sometimes it is a big gush of fluid, sometimes it is just a trickle that keeps getting your panties wet or running down your legs  You have vaginal bleeding.  It is normal to have a small amount of spotting if your cervix was checked.   You don't feel your baby moving like normal.  If you don't, get you something to eat and drink and lay down and focus on feeling your baby move.  You should feel at least 10 movements in 2 hours.  If you don't, you should call the office or go to [facility].    Tdap Vaccine  It is recommended that you get the Tdap vaccine during the third trimester of EACH pregnancy to help protect your baby from getting pertussis (whooping cough)  27-36 weeks is the BEST time to do this so that you can pass the protection on to your baby. During pregnancy is better than after pregnancy, but if you are unable to get it during pregnancy it will be offered at the hospital.   You will be offered this vaccine in the office after 27 weeks. If you do not have health insurance, you can get this vaccine at the health department or your family doctor  Everyone who will be around your baby should also be up-to-date on their vaccines. Adults (who are not pregnant) only need 1 dose of Tdap during adulthood.   Third Trimester of Pregnancy The third trimester is from week 29 through week 42, months 7 through 9. The third trimester is a time when the fetus is growing rapidly. At the end of the ninth month, the fetus is about 20 inches in length and weighs 6-10 pounds.  BODY CHANGES Your body goes through many changes during pregnancy. The changes vary from woman to  woman.   Your weight will continue to increase. You can expect to gain 25-35 pounds (11-16 kg) by the end of the pregnancy.  You may begin to get stretch marks on your hips, abdomen, and breasts.  You may urinate more often because the fetus is moving lower into your pelvis and pressing on your bladder.  You may develop or continue to have heartburn as a result of your pregnancy.  You may develop constipation because certain hormones are causing the muscles that push waste through your intestines to slow down.  You may develop hemorrhoids or swollen, bulging veins (varicose veins).  You may have pelvic pain because of the weight gain and pregnancy hormones relaxing your joints between the bones in your pelvis. Backaches may result from overexertion of the muscles supporting your posture.  You may have changes in your hair. These can include thickening of your hair, rapid growth, and changes in texture. Some women also have hair loss during or after pregnancy, or hair that feels dry or thin. Your hair will most likely return to normal after your baby is born.  Your breasts will continue to grow and be tender. A yellow discharge may leak from your breasts called colostrum.  Your belly button may stick out.  You may feel short of breath because of your expanding uterus.  You may notice the fetus "dropping," or moving lower in your abdomen.  You may have a bloody mucus discharge. This usually occurs a few days to a week before labor begins.  Your cervix becomes thin and soft (effaced) near your due date. WHAT TO EXPECT AT YOUR PRENATAL EXAMS  You will have prenatal exams every 2 weeks until week 36. Then, you will have weekly prenatal exams. During a routine prenatal visit:  You will be weighed to make sure you and the fetus are growing normally.  Your blood pressure is taken.  Your abdomen will be measured to track your baby's growth.  The fetal heartbeat will be listened  to.  Any test results from the previous visit will be discussed.  You may have a cervical check near your due date to see if you have effaced. At around 36 weeks, your caregiver will check your cervix. At the same time, your caregiver will also perform a test on the secretions of the vaginal tissue. This test is to determine if a type of bacteria, Group B streptococcus, is present. Your caregiver will explain this further. Your caregiver may ask you:  What your birth plan is.  How you are feeling.  If you are feeling the baby move.  If you have had any abnormal symptoms, such as leaking fluid, bleeding, severe headaches, or abdominal cramping.  If you have any questions. Other tests or screenings that may be performed during your third trimester include:  Blood tests that check for low iron levels (anemia).  Fetal testing to check the health, activity level, and growth of the fetus. Testing is done if you have certain medical conditions or if there are problems during the pregnancy. FALSE LABOR You may feel small, irregular contractions that eventually go away. These are called Braxton Hicks contractions, or false labor. Contractions may last for hours, days, or even weeks before true labor sets in. If contractions come at regular intervals, intensify, or become painful, it is best to be seen by your caregiver.  SIGNS OF LABOR   Menstrual-like cramps.  Contractions that are 5 minutes apart or less.  Contractions that start on the top of the uterus and spread down to the lower abdomen and back.  A sense of increased pelvic pressure or back pain.  A watery or bloody mucus discharge that comes from the vagina. If you have any of these signs before the 37th week of pregnancy, call your caregiver right away. You need to go to the hospital to get checked immediately. HOME CARE INSTRUCTIONS   Avoid all smoking, herbs, alcohol, and unprescribed drugs. These chemicals affect the  formation and growth of the baby.  Follow your caregiver's instructions regarding medicine use. There are medicines that are either safe or unsafe to take during pregnancy.  Exercise only as directed by your caregiver. Experiencing uterine cramps is a good sign to stop exercising.  Continue to eat regular, healthy meals.  Wear a good support bra for breast tenderness.  Do not use hot tubs, steam rooms, or saunas.  Wear your seat belt at all times when driving.  Avoid raw meat, uncooked cheese, cat litter boxes, and soil used by cats. These carry germs that can cause birth defects in the baby.  Take your prenatal vitamins.  Try taking a stool softener (if your caregiver approves) if you develop constipation. Eat more high-fiber foods, such as fresh vegetables or fruit and whole grains. Drink plenty of fluids to keep your urine  clear or pale yellow.  Take warm sitz baths to soothe any pain or discomfort caused by hemorrhoids. Use hemorrhoid cream if your caregiver approves.  If you develop varicose veins, wear support hose. Elevate your feet for 15 minutes, 3-4 times a day. Limit salt in your diet.  Avoid heavy lifting, wear low heal shoes, and practice good posture.  Rest a lot with your legs elevated if you have leg cramps or low back pain.  Visit your dentist if you have not gone during your pregnancy. Use a soft toothbrush to brush your teeth and be gentle when you floss.  A sexual relationship may be continued unless your caregiver directs you otherwise.  Do not travel far distances unless it is absolutely necessary and only with the approval of your caregiver.  Take prenatal classes to understand, practice, and ask questions about the labor and delivery.  Make a trial run to the hospital.  Pack your hospital bag.  Prepare the baby's nursery.  Continue to go to all your prenatal visits as directed by your caregiver. SEEK MEDICAL CARE IF:  You are unsure if you are in  labor or if your water has broken.  You have dizziness.  You have mild pelvic cramps, pelvic pressure, or nagging pain in your abdominal area.  You have persistent nausea, vomiting, or diarrhea.  You have a bad smelling vaginal discharge.  You have pain with urination. SEEK IMMEDIATE MEDICAL CARE IF:   You have a fever.  You are leaking fluid from your vagina.  You have spotting or bleeding from your vagina.  You have severe abdominal cramping or pain.  You have rapid weight loss or gain.  You have shortness of breath with chest pain.  You notice sudden or extreme swelling of your face, hands, ankles, feet, or legs.  You have not felt your baby move in over an hour.  You have severe headaches that do not go away with medicine.  You have vision changes. Document Released: 09/29/2001 Document Revised: 10/10/2013 Document Reviewed: 12/06/2012 [facility] Patient Information 2015 [place], [place]. This information is not intended to replace advice given to you by your health care provider. Make sure you discuss any questions you have with your health care provider.

## 2019-05-09 ENCOUNTER — Telehealth: Payer: Self-pay | Admitting: Women's Health

## 2019-05-09 NOTE — Telephone Encounter (Signed)
Just a reminder that your appointment for tomorrow is scheduled as a virtual visit and NOT in person.  We will call you around your appointment time.  Have a great day.   ° ° °

## 2019-05-10 ENCOUNTER — Encounter: Payer: Self-pay | Admitting: Women's Health

## 2019-05-10 ENCOUNTER — Telehealth (INDEPENDENT_AMBULATORY_CARE_PROVIDER_SITE_OTHER): Payer: Medicaid Other | Admitting: Women's Health

## 2019-05-10 ENCOUNTER — Other Ambulatory Visit: Payer: Self-pay

## 2019-05-10 VITALS — BP 109/63 | HR 70

## 2019-05-10 DIAGNOSIS — Z3483 Encounter for supervision of other normal pregnancy, third trimester: Secondary | ICD-10-CM

## 2019-05-10 NOTE — Patient Instructions (Signed)
[name], I greatly value your feedback.  If you receive a survey following your visit with [place] today, we appreciate you taking the time to fill it out.  Thanks, [name], CNM, [facility]  [place]!!! It is now [facility] ([place], [place] [address]) Entrance located off of [address] parking   Go to [email or web address] to register for FREE online childbirth classes    Call the office [phone]) or go to [facility] if:  You begin to have strong, frequent contractions  Your water breaks.  Sometimes it is a big gush of fluid, sometimes it is just a trickle that keeps getting your panties wet or running down your legs  You have vaginal bleeding.  It is normal to have a small amount of spotting if your cervix was checked.   You don't feel your baby moving like normal.  If you don't, get you something to eat and drink and lay down and focus on feeling your baby move.  You should feel at least 10 movements in 2 hours.  If you don't, you should call the office or go to [place] Blood Pressure Monitoring for Patients   Your provider has recommended that you check your blood pressure (BP) at least once a week at home. If you do not have a blood pressure cuff at home, one will be provided for you. Contact your provider if you have not received your monitor within 1 week.   Helpful Tips for Accurate Home Blood Pressure Checks  . Don't smoke, exercise, or drink caffeine 30 minutes before checking your BP . Use the restroom before checking your BP (a full bladder can raise your pressure) . Relax in a comfortable upright chair . Feet on the ground . Left arm resting comfortably on a flat surface at the level of your heart . Legs uncrossed . Back supported . Sit quietly and don't talk . Place the cuff on your bare arm . Adjust snuggly, so that only two fingertips can fit between your skin  and the top of the cuff . Check 2 readings separated by at least one minute . Keep a log of your BP readings . For a visual, please reference this diagram: http://ccnc.care/bpdiagram  Provider Name: [facility]     Phone: [phone]  Zone 1: ALL CLEAR  Continue to monitor your symptoms:  . BP reading is less than 140 (top number) or less than 90 (bottom number)  . No right upper stomach pain . No headaches or seeing spots . No feeling nauseated or throwing up . No swelling in face and hands  Zone 2: CAUTION Call your doctor's office for any of the following:  . BP reading is greater than 140 (top number) or greater than 90 (bottom number)  . Stomach pain under your ribs in the middle or right side . Headaches or seeing spots . Feeling nauseated or throwing up . Swelling in face and hands  Zone 3: EMERGENCY  Seek immediate medical care if you have any of the following:  . BP reading is greater than160 (top number) or greater than 110 (bottom number) . Severe headaches not improving with Tylenol . Serious difficulty catching your breath . Any worsening symptoms from Zone 2    Preterm Labor and Birth Information  The normal length of a pregnancy is 39-41 weeks. Preterm labor is when labor starts  before 37 completed weeks of pregnancy. What are the risk factors for preterm labor? Preterm labor is more likely to occur in women who:  Have certain infections during pregnancy such as a bladder infection, sexually transmitted infection, or infection inside the uterus (chorioamnionitis).  Have a shorter-than-normal cervix.  Have gone into preterm labor before.  Have had surgery on their cervix.  Are younger than age 6 or older than age 54.  Are African American.  Are pregnant with twins or multiple babies (multiple gestation).  Take street drugs or smoke while pregnant.  Do not gain enough weight while pregnant.  Became pregnant shortly after having been  pregnant. What are the symptoms of preterm labor? Symptoms of preterm labor include:  Cramps similar to those that can happen during a menstrual period. The cramps may happen with diarrhea.  Pain in the abdomen or lower back.  Regular uterine contractions that may feel like tightening of the abdomen.  A feeling of increased pressure in the pelvis.  Increased watery or bloody mucus discharge from the vagina.  Water breaking (ruptured amniotic sac). Why is it important to recognize signs of preterm labor? It is important to recognize signs of preterm labor because babies who are born prematurely may not be fully developed. This can put them at an increased risk for:  Long-term (chronic) heart and lung problems.  Difficulty immediately after birth with regulating body systems, including blood sugar, body temperature, heart rate, and breathing rate.  Bleeding in the brain.  Cerebral palsy.  Learning difficulties.  Death. These risks are highest for babies who are born before 38 weeks of pregnancy. How is preterm labor treated? Treatment depends on the length of your pregnancy, your condition, and the health of your baby. It may involve:  Having a stitch (suture) placed in your cervix to prevent your cervix from opening too early (cerclage).  Taking or being given medicines, such as: ? Hormone medicines. These may be given early in pregnancy to help support the pregnancy. ? Medicine to stop contractions. ? Medicines to help mature the baby's lungs. These may be prescribed if the risk of delivery is high. ? Medicines to prevent your baby from developing cerebral palsy. If the labor happens before 34 weeks of pregnancy, you may need to stay in the hospital. What should I do if I think I am in preterm labor? If you think that you are going into preterm labor, call your health care provider right away. How can I prevent preterm labor in future pregnancies? To increase your chance  of having a full-term pregnancy:  Do not use any tobacco products, such as cigarettes, chewing tobacco, and e-cigarettes. If you need help quitting, ask your health care provider.  Do not use street drugs or medicines that have not been prescribed to you during your pregnancy.  Talk with your health care provider before taking any herbal supplements, even if you have been taking them regularly.  Make sure you gain a healthy amount of weight during your pregnancy.  Watch for infection. If you think that you might have an infection, get it checked right away.  Make sure to tell your health care provider if you have gone into preterm labor before. This information is not intended to replace advice given to you by your health care provider. Make sure you discuss any questions you have with your health care provider. Document Released: 12/26/2003 Document Revised: 01/27/2019 Document Reviewed: 02/26/2016 Elsevier Patient Education  2020 [employer].

## 2019-05-10 NOTE — Progress Notes (Signed)
   [place] VIRTUAL OBSTETRICS VISIT ENCOUNTER NOTE Patient name: Crystal Gould MRN [id number]  Date of birth: 1991/02/11  I connected with patient on 05/10/19 at  2:30 PM EDT by [facility] and verified that I am speaking with the correct person using two identifiers. Due to COVID-19 recommendations, pt is not currently in our office.    I discussed the limitations, risks, security and privacy concerns of performing an evaluation and management service by telephone and the availability of in person appointments. I also discussed with the patient that there may be a patient responsible charge related to this service. The patient expressed understanding and agreed to proceed.  Chief Complaint:   Routine Prenatal Visit  History of Present Illness:   Crystal Gould is a 28 y.o. [id number] female at [redacted]w[redacted]d with an Estimated Date of Delivery: 07/05/19 being evaluated today for ongoing management of a low-risk pregnancy.  Today she reports no complaints. Contractions: Not present. Vag. Bleeding: None.  Movement: Present. denies leaking of fluid. Review of Systems:   Pertinent items are noted in HPI Denies abnormal vaginal discharge w/ itching/odor/irritation, headaches, visual changes, shortness of breath, chest pain, abdominal pain, severe nausea/vomiting, or problems with urination or bowel movements unless otherwise stated above. Pertinent History Reviewed:  Reviewed past medical,surgical, social, obstetrical and family history.  Reviewed problem list, medications and allergies. Physical Assessment:   Vitals:   05/10/19 1059  BP: 109/63  Pulse: 70  There is no height or weight on file to calculate BMI.        Physical Examination:   General:  Alert, oriented and cooperative.   Mental Status: Normal mood and affect perceived. Normal judgment and thought content.  Rest of physical exam deferred due to type of encounter  No results found for this or any previous visit (from the past 24 hour(s)).   Assessment & Plan:  1) Pregnancy [id number] at [redacted]w[redacted]d with an Estimated Date of Delivery: 07/05/19    Meds: No orders of the defined types were placed in this encounter.  Labs/procedures today: none  Plan:  Continue routine obstetrical care.  Has home bp cuff.  Check bp weekly, let us know if >140/90.   Reviewed: Preterm labor symptoms and general obstetric precautions including but not limited to vaginal bleeding, contractions, leaking of fluid and fetal movement were reviewed in detail with the patient. The patient was advised to call back or seek an in-person office evaluation/go to [facility] for any urgent or concerning symptoms. All questions were answered. Please refer to After Visit Summary for other counseling recommendations.    I provided 10 minutes of non-face-to-face time during this encounter.  Follow-up: No follow-ups on file.  No orders of the defined types were placed in this encounter.  [place], [facility] 05/10/2019 11:03 AM

## 2019-05-24 ENCOUNTER — Other Ambulatory Visit: Payer: Self-pay

## 2019-05-24 ENCOUNTER — Encounter: Payer: Self-pay | Admitting: Advanced Practice Midwife

## 2019-05-24 ENCOUNTER — Telehealth (INDEPENDENT_AMBULATORY_CARE_PROVIDER_SITE_OTHER): Payer: Medicaid Other | Admitting: Advanced Practice Midwife

## 2019-05-24 VITALS — BP 123/73 | HR 72

## 2019-05-24 DIAGNOSIS — Z3A34 34 weeks gestation of pregnancy: Secondary | ICD-10-CM | POA: Diagnosis not present

## 2019-05-24 DIAGNOSIS — Z3483 Encounter for supervision of other normal pregnancy, third trimester: Secondary | ICD-10-CM | POA: Diagnosis not present

## 2019-05-24 NOTE — Patient Instructions (Addendum)
AM I IN LABOR? What is labor? Labor is the work that your body does to birth your baby. Your uterus (the womb) contracts. Your cervix (the mouth of the uterus) opens. You will push your baby out into the world.  What do contractions (labor pains) feel like? When they first start, contractions usually feel like cramps during your period. Sometimes you feel pain in your back. Most often, contractions feel like muscles pulling painfully in your lower belly. At first, the contractions will probably be 15 to 20 minutes apart. They will not feel too painful. As labor goes on, the contractions get stronger, closer together, and more painful.  How do I time the contractions? Time your contractions by counting the number of minutes from the start of one contraction to the start of the next contraction.  What should I do when the contractions start? If it is night and you can sleep, sleep. If it happens during the day, here are some things you can do to take care of yourself at home: ? Walk. If the pains you are having are real labor, walking will make the contractions come faster and harder. If the contractions are not going to continue and be real labor, walking will make the contractions slow down. ? Take a shower or bath. This will help you relax. ? Eat. Labor is a big event. It takes a lot of energy. ? Drink water. Not drinking enough water can cause false labor (contractions that hurt but do not open your cervix). If this is true labor, drinking water will help you have strength to get through your labor. ? Take a nap. Get all the rest you can. ? Get a massage. If your labor is in your back, a strong massage on your lower back may feel very good. Getting a foot massage is always good. ? Don't panic. You can do this. Your body was made for this. You are strong!  When should I go to the hospital or call my health care provider? ? Your contractions have been 5 minutes apart or less for at least 1  hour. ? If several contractions are so painful you cannot walk or talk during one. ? Your bag of waters breaks. (You may have a big gush of water or just water that runs down your legs when you walk.)  Are there other reasons to call my health care provider? Yes, you should call your health care provider or go to the hospital if you start to bleed like you are having a period- blood that soaks your underwear or runs down your legs, if you have sudden severe pain, if your baby has not moved for several hours, or if you are leaking green fluid. The rule is as follows: If you are very concerned about something, call.   [email or web address]

## 2019-05-24 NOTE — Progress Notes (Signed)
   TELEHEALTH VIRTUAL OBSTETRICS VISIT ENCOUNTER NOTE  I connected with Crystal Gould on 05/24/19 at  1:45 PM EDT by telephone at home and verified that I am speaking with the correct person using two identifiers.   I discussed the limitations, risks, security and privacy concerns of performing an evaluation and management service by telephone and the availability of in person appointments. I also discussed with the patient that there may be a patient responsible charge related to this service. The patient expressed understanding and agreed to proceed.  Subjective:  Crystal Gould is a 28 y.o. [id number] at [redacted]w[redacted]d being followed for ongoing prenatal care.  She is currently monitored for the following issues for this low-risk pregnancy and has Supervision of normal pregnancy; Chlamydia; Contraception management; and BV (bacterial vaginosis) on their problem list.  Patient reports no complaints. Reports fetal movement. Denies any contractions, bleeding or leaking of fluid.   The following portions of the patient's history were reviewed and updated as appropriate: allergies, current medications, past family history, past medical history, past social history, past surgical history and problem list.   Objective:   General:  Alert, oriented and cooperative.   Mental Status: Normal mood and affect perceived. Normal judgment and thought content.  Rest of physical exam deferred due to type of encounter  Assessment and Plan:  Pregnancy: G8T1572 at [redacted]w[redacted]d There are no diagnoses linked to this encounter. Preterm labor symptoms and general obstetric precautions including but not limited to vaginal bleeding, contractions, leaking of fluid and fetal movement were reviewed in detail with the patient.  I discussed the assessment and treatment plan with the patient. The patient was provided an opportunity to ask questions and all were answered. The patient agreed with the plan and demonstrated an understanding  of the instructions. The patient was advised to call back or seek an in-person office evaluation/go to MAU at [facility] for any urgent or concerning symptoms. Please refer to After Visit Summary for other counseling recommendations.   I provided 10 minutes of non-face-to-face time during this encounter.  Return in about 2 weeks (around 06/07/2019) for [place].  No future appointments.  Christin Fudge, [place] for [employer], [place]

## 2019-06-07 ENCOUNTER — Encounter: Payer: Medicaid Other | Admitting: Advanced Practice Midwife

## 2019-06-08 ENCOUNTER — Ambulatory Visit (INDEPENDENT_AMBULATORY_CARE_PROVIDER_SITE_OTHER): Payer: Medicaid Other | Admitting: Obstetrics & Gynecology

## 2019-06-08 ENCOUNTER — Other Ambulatory Visit: Payer: Self-pay

## 2019-06-08 VITALS — BP 120/79 | HR 96 | Wt 156.5 lb

## 2019-06-08 DIAGNOSIS — Z3A36 36 weeks gestation of pregnancy: Secondary | ICD-10-CM

## 2019-06-08 DIAGNOSIS — Z3483 Encounter for supervision of other normal pregnancy, third trimester: Secondary | ICD-10-CM

## 2019-06-08 NOTE — Progress Notes (Signed)
   LOW-RISK PREGNANCY VISIT Patient name: Crystal Gould MRN [id number]  Date of birth: Jul 20, 1991 Chief Complaint:   Routine Prenatal Visit  History of Present Illness:   Crystal Gould is a 28 y.o. [id number] female at [redacted]w[redacted]d with an Estimated Date of Delivery: 07/05/19 being seen today for ongoing management of a low-risk pregnancy.  Today she reports no complaints. Contractions: Irritability. Vag. Bleeding: None.  Movement: Present. denies leaking of fluid. Review of Systems:   Pertinent items are noted in HPI Denies abnormal vaginal discharge w/ itching/odor/irritation, headaches, visual changes, shortness of breath, chest pain, abdominal pain, severe nausea/vomiting, or problems with urination or bowel movements unless otherwise stated above. Pertinent History Reviewed:  Reviewed past medical,surgical, social, obstetrical and family history.  Reviewed problem list, medications and allergies. Physical Assessment:   Vitals:   06/08/19 1103  BP: 120/79  Pulse: 96  Weight: 156 lb 8 oz (71 kg)  Body mass index is 28.62 kg/m.        Physical Examination:   General appearance: Well appearing, and in no distress  Mental status: Alert, oriented to person, place, and time  Skin: Warm & dry  Cardiovascular: Normal heart rate noted  Respiratory: Normal respiratory effort, no distress  Abdomen: Soft, gravid, nontender  Pelvic: Cervical exam performed  Dilation: 2 Effacement (%): Thick Station: Ballotable  Extremities: Edema: None  Fetal Status: Fetal Heart Rate (bpm): 149 Fundal Height: 34 cm Movement: Present Presentation: Vertex  No results found for this or any previous visit (from the past 24 hour(s)).  Assessment & Plan:  1) Low-risk pregnancy [id number] at [redacted]w[redacted]d with an Estimated Date of Delivery: 07/05/19   2) cultures done   Meds: No orders of the defined types were placed in this encounter.  Labs/procedures today:   Plan:  Continue routine obstetrical care   Reviewed: Term  labor symptoms and general obstetric precautions including but not limited to vaginal bleeding, contractions, leaking of fluid and fetal movement were reviewed in detail with the patient.  All questions were answered. Has home bp cuff. Rx faxed to . Check bp weekly, let us know if >140/90.   Follow-up: Return in about 29 days (around 06/19/2019) for [place].  Orders Placed This Encounter  Procedures  . Culture, beta strep (group b only)  . GC/Chlamydia Probe Amp   Florian Buff  06/08/2019 11:19 AM

## 2019-06-10 LAB — GC/CHLAMYDIA PROBE AMP
Chlamydia trachomatis, NAA: NEGATIVE
Neisseria Gonorrhoeae by PCR: NEGATIVE

## 2019-06-12 LAB — CULTURE, BETA STREP (GROUP B ONLY): Strep Gp B Culture: NEGATIVE

## 2019-06-19 ENCOUNTER — Encounter: Payer: Medicaid Other | Admitting: Obstetrics & Gynecology

## 2019-06-22 ENCOUNTER — Telehealth (INDEPENDENT_AMBULATORY_CARE_PROVIDER_SITE_OTHER): Payer: Medicaid Other | Admitting: Women's Health

## 2019-06-22 ENCOUNTER — Encounter: Payer: Self-pay | Admitting: Women's Health

## 2019-06-22 ENCOUNTER — Other Ambulatory Visit: Payer: Self-pay

## 2019-06-22 VITALS — BP 124/80 | HR 106 | Ht 62.0 in

## 2019-06-22 DIAGNOSIS — Z3A38 38 weeks gestation of pregnancy: Secondary | ICD-10-CM

## 2019-06-22 DIAGNOSIS — Z3483 Encounter for supervision of other normal pregnancy, third trimester: Secondary | ICD-10-CM

## 2019-06-22 NOTE — Patient Instructions (Signed)
Crystal Gould, I greatly value your feedback.  If you receive a survey following your visit with [place] today, we appreciate you taking the time to fill it out.  Thanks, Crystal Gould, CNM, [facility]  [name] It is now [name] at [facility] ([place], [place] [address]) Entrance located off of [address] parking   Go to [email or web address] to register for FREE online childbirth classes    Call the office [phone]) or go to [facility] if:  You begin to have strong, frequent contractions  Your water breaks.  Sometimes it is a big gush of fluid, sometimes it is just a trickle that keeps getting your panties wet or running down your legs  You have vaginal bleeding.  It is normal to have a small amount of spotting if your cervix was checked.   You don't feel your baby moving like normal.  If you don't, get you something to eat and drink and lay down and focus on feeling your baby move.  You should feel at least 10 movements in 2 hours.  If you don't, you should call the office or go to [place] Blood Pressure Monitoring for Patients   Your provider has recommended that you check your blood pressure (BP) at least once a week at home. If you do not have a blood pressure cuff at home, one will be provided for you. Contact your provider if you have not received your monitor within 1 week.   Helpful Tips for Accurate Home Blood Pressure Checks   Don't smoke, exercise, or drink caffeine 30 minutes before checking your BP  Use the restroom before checking your BP (a full bladder can raise your pressure)  Relax in a comfortable upright chair  Feet on the ground  Left arm resting comfortably on a flat surface at the level of your heart  Legs uncrossed  Back supported  Sit quietly and don't talk  Place the cuff on your bare arm  Adjust snuggly, so that only two fingertips can fit between your skin  and the top of the cuff  Check 2 readings separated by at least one minute  Keep a log of your BP readings  For a visual, please reference this diagram: http://ccnc.care/bpdiagram  Provider Name: [facility]     Phone: [phone]  Zone 1: ALL CLEAR  Continue to monitor your symptoms:   BP reading is less than 140 (top number) or less than 90 (bottom number)   No right upper stomach pain  No headaches or seeing spots  No feeling nauseated or throwing up  No swelling in face and hands  Zone 2: CAUTION Call your doctor's office for any of the following:   BP reading is greater than 140 (top number) or greater than 90 (bottom number)   Stomach pain under your ribs in the middle or right side  Headaches or seeing spots  Feeling nauseated or throwing up  Swelling in face and hands  Zone 3: EMERGENCY  Seek immediate medical care if you have any of the following:   BP reading is greater than160 (top number) or greater than 110 (bottom number)  Severe headaches not improving with Tylenol  Serious difficulty catching your breath  Any worsening symptoms from Zone 2    Braxton Hicks Contractions Contractions of the uterus can occur throughout pregnancy, but they are not always a sign that you are  in labor. You may have practice contractions called Braxton Hicks contractions. These false labor contractions are sometimes confused with true labor. What are Crystal Gould contractions? Braxton Hicks contractions are tightening movements that occur in the muscles of the uterus before labor. Unlike true labor contractions, these contractions do not result in opening (dilation) and thinning of the cervix. Toward the end of pregnancy (32-34 weeks), Braxton Hicks contractions can happen more often and may become stronger. These contractions are sometimes difficult to tell apart from true labor because they can be very uncomfortable. You should not feel embarrassed if you go to  the hospital with false labor. Sometimes, the only way to tell if you are in true labor is for your health care provider to look for changes in the cervix. The health care provider will do a physical exam and may monitor your contractions. If you are not in true labor, the exam should show that your cervix is not dilating and your water has not broken. If there are no other health problems associated with your pregnancy, it is completely safe for you to be sent home with false labor. You may continue to have Braxton Hicks contractions until you go into true labor. How to tell the difference between true labor and false labor True labor  Contractions last 30-70 seconds.  Contractions become very regular.  Discomfort is usually felt in the top of the uterus, and it spreads to the lower abdomen and low back.  Contractions do not go away with walking.  Contractions usually become more intense and increase in frequency.  The cervix dilates and gets thinner. False labor  Contractions are usually shorter and not as strong as true labor contractions.  Contractions are usually irregular.  Contractions are often felt in the front of the lower abdomen and in the groin.  Contractions may go away when you walk around or change positions while lying down.  Contractions get weaker and are shorter-lasting as time goes on.  The cervix usually does not dilate or become thin. Follow these instructions at home:   Take over-the-counter and prescription medicines only as told by your health care provider.  Keep up with your usual exercises and follow other instructions from your health care provider.  Eat and drink lightly if you think you are going into labor.  If Braxton Hicks contractions are making you uncomfortable: ? Change your position from lying down or resting to walking, or change from walking to resting. ? Sit and rest in a tub of warm water. ? Drink enough fluid to keep your urine  pale yellow. Dehydration may cause these contractions. ? Do slow and deep breathing several times an hour.  Keep all follow-up prenatal visits as told by your health care provider. This is important. Contact a health care provider if:  You have a fever.  You have continuous pain in your abdomen. Get help right away if:  Your contractions become stronger, more regular, and closer together.  You have fluid leaking or gushing from your vagina.  You pass blood-tinged mucus (bloody show).  You have bleeding from your vagina.  You have low back pain that you never had before.  You feel your babys head pushing down and causing pelvic pressure.  Your baby is not moving inside you as much as it used to. Summary  Contractions that occur before labor are called Braxton Hicks contractions, false labor, or practice contractions.  Braxton Hicks contractions are usually shorter, weaker, farther apart, and  less regular than true labor contractions. True labor contractions usually become progressively stronger and regular, and they become more frequent.  Manage discomfort from [facility] contractions by changing position, resting in a warm bath, drinking plenty of water, or practicing deep breathing. This information is not intended to replace advice given to you by your health care provider. Make sure you discuss any questions you have with your health care provider. Document Released: 02/18/2017 Document Revised: 09/17/2017 Document Reviewed: 02/18/2017 Elsevier Patient Education  2020 [employer].

## 2019-06-22 NOTE — Progress Notes (Signed)
   [place] VIRTUAL OBSTETRICS VISIT ENCOUNTER NOTE Patient name: Crystal Gould MRN [id number]  Date of birth: [birth year]  I connected with patient on 06/22/19 at  3:50 PM EDT by MyChart video and verified that I am speaking with the correct person using two identifiers. Due to COVID-19 recommendations, pt is not currently in our office.    I discussed the limitations, risks, security and privacy concerns of performing an evaluation and management service by telephone and the availability of in person appointments. I also discussed with the patient that there may be a patient responsible charge related to this service. The patient expressed understanding and agreed to proceed.  Chief Complaint:   Routine Prenatal Visit (contractions off and on)  History of Present Illness:   Crystal Gould is a 28 y.o. [id number] female at [redacted]w[redacted]d with an Estimated Date of Delivery: 07/05/19 being evaluated today for ongoing management of a low-risk pregnancy.  Today she reports no complaints, wants membrane sweeping. Contractions: Irregular. Vag. Bleeding: None.  Movement: Present. denies leaking of fluid. Review of Systems:   Pertinent items are noted in HPI Denies abnormal vaginal discharge w/ itching/odor/irritation, headaches, visual changes, shortness of breath, chest pain, abdominal pain, severe nausea/vomiting, or problems with urination or bowel movements unless otherwise stated above. Pertinent History Reviewed:  Reviewed past medical,surgical, social, obstetrical and family history.  Reviewed problem list, medications and allergies. Physical Assessment:   Vitals:   06/22/19 1558  BP: 124/80  Pulse: (!) 106  Height: 5\' 2"  (1.575 m)  Body mass index is 28.62 kg/m.        Physical Examination:   General:  Alert, oriented and cooperative.   Mental Status: Normal mood and affect perceived. Normal judgment and thought content.  Rest of physical exam deferred due to type of encounter  No results  found for this or any previous visit (from the past 24 hour(s)).  Assessment & Plan:  1) Pregnancy [id number] at [redacted]w[redacted]d with an Estimated Date of Delivery: 07/05/19    Meds: No orders of the defined types were placed in this encounter.  Labs/procedures today: none  Plan:  Continue routine obstetrical care.  Has home bp cuff.  Check bp weekly, let us know if >140/90.   Reviewed: Term labor symptoms and general obstetric precautions including but not limited to vaginal bleeding, contractions, leaking of fluid and fetal movement were reviewed in detail with the patient. The patient was advised to call back or seek an in-person office evaluation/go to [facility] at [facility] for any urgent or concerning symptoms. All questions were answered. Please refer to After Visit Summary for other counseling recommendations.    I provided 10 minutes of non-face-to-face time during this encounter.  Follow-up: Return in about 1 week (around 06/29/2019) for LROB, in person w/ membrane sweep.  No orders of the defined types were placed in this encounter.  [place], [facility] 06/22/2019 4:16 PM

## 2019-06-29 ENCOUNTER — Other Ambulatory Visit: Payer: Self-pay

## 2019-06-29 ENCOUNTER — Ambulatory Visit (INDEPENDENT_AMBULATORY_CARE_PROVIDER_SITE_OTHER): Payer: Medicaid Other | Admitting: Advanced Practice Midwife

## 2019-06-29 VITALS — BP 128/86 | HR 97 | Wt 157.2 lb

## 2019-06-29 DIAGNOSIS — O163 Unspecified maternal hypertension, third trimester: Secondary | ICD-10-CM

## 2019-06-29 DIAGNOSIS — Z3A39 39 weeks gestation of pregnancy: Secondary | ICD-10-CM | POA: Diagnosis not present

## 2019-06-29 DIAGNOSIS — Z331 Pregnant state, incidental: Secondary | ICD-10-CM

## 2019-06-29 DIAGNOSIS — O368390 Maternal care for abnormalities of the fetal heart rate or rhythm, unspecified trimester, not applicable or unspecified: Secondary | ICD-10-CM

## 2019-06-29 DIAGNOSIS — Z1389 Encounter for screening for other disorder: Secondary | ICD-10-CM

## 2019-06-29 DIAGNOSIS — Z3483 Encounter for supervision of other normal pregnancy, third trimester: Secondary | ICD-10-CM

## 2019-06-29 LAB — POCT URINALYSIS DIPSTICK OB
Blood, UA: NEGATIVE
Glucose, UA: NEGATIVE
Nitrite, UA: NEGATIVE

## 2019-06-29 NOTE — Progress Notes (Signed)
   PRENATAL VISIT NOTE  Subjective:  Crystal Gould is a 28 y.o. [id number] at [redacted]w[redacted]d being seen today for ongoing prenatal care.  She is currently monitored for the following issues for this low-risk pregnancy and has Supervision of normal pregnancy; Chlamydia; Contraception management; and BV (bacterial vaginosis) on their problem list.  Patient reports occasional contractions.  Contractions: Irregular. Vag. Bleeding: None.  Movement: Present. Denies leaking of fluid.   The following portions of the patient's history were reviewed and updated as appropriate: allergies, current medications, past family history, past medical history, past social history, past surgical history and problem list.   Objective:   Vitals:   06/29/19 1410 06/29/19 1416  BP: (!) 144/93 128/86  Pulse: 90 97  Weight: 157 lb 3.2 oz (71.3 kg)     Fetal Status: Fetal Heart Rate (bpm): 190 Fundal Height: 39 cm Movement: Present  Presentation: Vertex  General:  Alert, oriented and cooperative. Patient is in no acute distress.  Skin: Skin is warm and dry. No rash noted.   Cardiovascular: Normal heart rate noted  Respiratory: Normal respiratory effort, no problems with respiration noted  Abdomen: Soft, gravid, appropriate for gestational age.  Pain/Pressure: Present     Pelvic: Cervical exam performed Dilation: 4 Effacement (%): 70 Station: -2. Membranes swept   Extremities: Normal range of motion.  Edema: Trace  Mental Status: Normal mood and affect. Normal behavior. Normal judgment and thought content.   Assessment and Plan:  Pregnancy: D1S9702 at 101w1d 1. Pregnant state, incidental  - POC Urinalysis Dipstick OB  2. Screening for genitourinary condition  - POC Urinalysis Dipstick OB  3. Encounter for supervision of other normal pregnancy in third trimester   4. Fetal tachycardia affecting management of mother--per doppler.  - NST 130's, mod variability, 15x15 accels, no decels.   5. Hypertension third  trimester---x 1. No Pre-E Sx.  - Pre-E labs drawn - F/U 24 hours for BP check, review labs - Pre-E precautions.   Term labor symptoms and general obstetric precautions including but not limited to vaginal bleeding, contractions, leaking of fluid and fetal movement were reviewed in detail with the patient. Please refer to After Visit Summary for other counseling recommendations.   No follow-ups on file.  No future appointments.  Manya Silvas, CNM

## 2019-06-29 NOTE — Patient Instructions (Signed)
Preeclampsia and Eclampsia °Preeclampsia is a serious condition that may develop during pregnancy. This condition causes high blood pressure and increased protein in your urine along with other symptoms, such as headaches and vision changes. These symptoms may develop as the condition gets worse. Preeclampsia may occur at 20 weeks of pregnancy or later. °Diagnosing and treating preeclampsia early is very important. If not treated early, it can cause serious problems for you and your baby. One problem it can lead to is eclampsia. Eclampsia is a condition that causes muscle jerking or shaking (convulsions or seizures) and other serious problems for the mother. During pregnancy, delivering your baby may be the best treatment for preeclampsia or eclampsia. For most women, preeclampsia and eclampsia symptoms go away after giving birth. °In rare cases, a woman may develop preeclampsia after giving birth (postpartum preeclampsia). This usually occurs within 48 hours after childbirth but may occur up to 6 weeks after giving birth. °What are the causes? °The cause of preeclampsia is not known. °What increases the risk? °The following risk factors make you more likely to develop preeclampsia: °· Being pregnant for the first time. °· Having had preeclampsia during a past pregnancy. °· Having a family history of preeclampsia. °· Having high blood pressure. °· Being pregnant with more than one baby. °· Being 35 or older. °· Being African-American. °· Having kidney disease or diabetes. °· Having medical conditions such as lupus or blood diseases. °· Being very overweight (obese). °What are the signs or symptoms? °The most common symptoms are: °· Severe headaches. °· Vision problems, such as blurred or double vision. °· Abdominal pain, especially upper abdominal pain. °Other symptoms that may develop as the condition gets worse include: °· Sudden weight gain. °· Sudden swelling of the hands, face, legs, and feet. °· Severe nausea  and vomiting. °· Numbness in the face, arms, legs, and feet. °· Dizziness. °· Urinating less than usual. °· Slurred speech. °· Convulsions or seizures. °How is this diagnosed? °There are no screening tests for preeclampsia. Your health care provider will ask you about symptoms and check for signs of preeclampsia during your prenatal visits. You may also have tests that include: °· Checking your blood pressure. °· Urine tests to check for protein. Your health care provider will check for this at every prenatal visit. °· Blood tests. °· Monitoring your baby's heart rate. °· Ultrasound. °How is this treated? °You and your health care provider will determine the treatment approach that is best for you. Treatment may include: °· Having more frequent prenatal exams to check for signs of preeclampsia, if you have an increased risk for preeclampsia. °· Medicine to lower your blood pressure. °· Staying in the hospital, if your condition is severe. There, treatment will focus on controlling your blood pressure and the amount of fluids in your body (fluid retention). °· Taking medicine (magnesium sulfate) to prevent seizures. This may be given as an injection or through an IV. °· Taking a low-dose aspirin during your pregnancy. °· Delivering your baby early. You may have your labor started with medicine (induced), or you may have a cesarean delivery. °Follow these instructions at home: °Eating and drinking ° °· Drink enough fluid to keep your urine pale yellow. °· Avoid caffeine. °Lifestyle °· Do not use any products that contain nicotine or tobacco, such as cigarettes and e-cigarettes. If you need help quitting, ask your health care provider. °· Do not use alcohol or drugs. °· Avoid stress as much as possible. Rest and get   plenty of sleep. °General instructions °· Take over-the-counter and prescription medicines only as told by your health care provider. °· When lying down, lie on your left side. This keeps pressure off your  major blood vessels. °· When sitting or lying down, raise (elevate) your feet. Try putting some pillows underneath your lower legs. °· Exercise regularly. Ask your health care provider what kinds of exercise are best for you. °· Keep all follow-up and prenatal visits as told by your health care provider. This is important. °How is this prevented? °There is no known way of preventing preeclampsia or eclampsia from developing. However, to lower your risk of complications and detect problems early: °· Get regular prenatal care. Your health care provider may be able to diagnose and treat the condition early. °· Maintain a healthy weight. Ask your health care provider for help managing weight gain during pregnancy. °· Work with your health care provider to manage any long-term (chronic) health conditions you have, such as diabetes or kidney problems. °· You may have tests of your blood pressure and kidney function after giving birth. °· Your health care provider may have you take low-dose aspirin during your next pregnancy. °Contact a health care provider if: °· You have symptoms that your health care provider told you may require more treatment or monitoring, such as: °? Headaches. °? Nausea or vomiting. °? Abdominal pain. °? Dizziness. °? Light-headedness. °Get help right away if: °· You have severe: °? Abdominal pain. °? Headaches that do not get better. °? Dizziness. °? Vision problems. °? Confusion. °? Nausea or vomiting. °· You have any of the following: °? A seizure. °? Sudden, rapid weight gain. °? Sudden swelling in your hands, ankles, or face. °? Trouble moving any part of your body. °? Numbness in any part of your body. °? Trouble speaking. °? Abnormal bleeding. °· You faint. °Summary °· Preeclampsia is a serious condition that may develop during pregnancy. °· This condition causes high blood pressure and increased protein in your urine along with other symptoms, such as headaches and vision  changes. °· Diagnosing and treating preeclampsia early is very important. If not treated early, it can cause serious problems for you and your baby. °· Get help right away if you have symptoms that your health care provider told you to watch for. °This information is not intended to replace advice given to you by your health care provider. Make sure you discuss any questions you have with your health care provider. °Document Released: 10/02/2000 Document Revised: 06/07/2018 Document Reviewed: 05/11/2016 °Elsevier Patient Education © 2020 [employer] ° °

## 2019-06-30 ENCOUNTER — Other Ambulatory Visit: Payer: Self-pay

## 2019-06-30 ENCOUNTER — Encounter (HOSPITAL_COMMUNITY): Payer: Self-pay

## 2019-06-30 ENCOUNTER — Inpatient Hospital Stay (HOSPITAL_COMMUNITY)
Admission: AD | Admit: 2019-06-30 | Discharge: 2019-07-01 | DRG: 807 | Disposition: A | Discharge status: Home / Self Care | Payer: Medicaid Other | Source: Ambulatory Visit | Attending: Obstetrics and Gynecology | Admitting: Obstetrics and Gynecology

## 2019-06-30 ENCOUNTER — Other Ambulatory Visit: Payer: Medicaid Other

## 2019-06-30 DIAGNOSIS — Z30017 Encounter for initial prescription of implantable subdermal contraceptive: Secondary | ICD-10-CM

## 2019-06-30 DIAGNOSIS — Z3A39 39 weeks gestation of pregnancy: Secondary | ICD-10-CM

## 2019-06-30 DIAGNOSIS — F17210 Nicotine dependence, cigarettes, uncomplicated: Secondary | ICD-10-CM | POA: Diagnosis present

## 2019-06-30 DIAGNOSIS — Z309 Encounter for contraceptive management, unspecified: Secondary | ICD-10-CM

## 2019-06-30 DIAGNOSIS — Z20828 Contact with and (suspected) exposure to other viral communicable diseases: Secondary | ICD-10-CM | POA: Diagnosis present

## 2019-06-30 DIAGNOSIS — O99334 Smoking (tobacco) complicating childbirth: Secondary | ICD-10-CM | POA: Diagnosis present

## 2019-06-30 DIAGNOSIS — O26893 Other specified pregnancy related conditions, third trimester: Secondary | ICD-10-CM | POA: Diagnosis present

## 2019-06-30 LAB — CBC
HCT: 30.2 % — ABNORMAL LOW (ref 36.0–46.0)
Hematocrit: 31.8 % — ABNORMAL LOW (ref 34.0–46.6)
Hemoglobin: 9.8 g/dL — ABNORMAL LOW (ref 11.1–15.9)
Hemoglobin: 9.2 g/dL — ABNORMAL LOW (ref 12.0–15.0)
MCH: 24 pg — ABNORMAL LOW (ref 26.6–33.0)
MCH: 24 pg — ABNORMAL LOW (ref 26.0–34.0)
MCHC: 30.8 g/dL — ABNORMAL LOW (ref 31.5–35.7)
MCHC: 30.5 g/dL (ref 30.0–36.0)
MCV: 78 fL — ABNORMAL LOW (ref 79–97)
MCV: 78.9 fL — ABNORMAL LOW (ref 80.0–100.0)
Platelets: 156 10*3/uL (ref 150–450)
Platelets: 151 10*3/uL (ref 150–400)
RBC: 4.09 x10E6/uL (ref 3.77–5.28)
RBC: 3.83 MIL/uL — ABNORMAL LOW (ref 3.87–5.11)
RDW: 16.5 % — ABNORMAL HIGH (ref 11.7–15.4)
RDW: 16.8 % — ABNORMAL HIGH (ref 11.5–15.5)
WBC: 11.4 10*3/uL — ABNORMAL HIGH (ref 3.4–10.8)
WBC: 11.7 10*3/uL — ABNORMAL HIGH (ref 4.0–10.5)
nRBC: 0 % (ref 0.0–0.2)

## 2019-06-30 LAB — COMPREHENSIVE METABOLIC PANEL
ALT: 7 IU/L (ref 0–32)
AST: 15 IU/L (ref 0–40)
Albumin/Globulin Ratio: 1.2 (ref 1.2–2.2)
Albumin: 3.9 g/dL (ref 3.9–5.0)
Alkaline Phosphatase: 227 IU/L — ABNORMAL HIGH (ref 39–117)
BUN/Creatinine Ratio: 9 (ref 9–23)
BUN: 5 mg/dL — ABNORMAL LOW (ref 6–20)
Bilirubin Total: 0.3 mg/dL (ref 0.0–1.2)
CO2: 20 mmol/L (ref 20–29)
Calcium: 9.2 mg/dL (ref 8.7–10.2)
Chloride: 103 mmol/L (ref 96–106)
Creatinine, Ser: 0.56 mg/dL — ABNORMAL LOW (ref 0.57–1.00)
GFR calc Af Amer: 147 mL/min/{1.73_m2} (ref >59)
GFR calc non Af Amer: 127 mL/min/{1.73_m2} (ref >59)
Globulin, Total: 3.3 g/dL (ref 1.5–4.5)
Glucose: 62 mg/dL — ABNORMAL LOW (ref 65–99)
Potassium: 4.3 mmol/L (ref 3.5–5.2)
Sodium: 138 mmol/L (ref 134–144)
Total Protein: 7.2 g/dL (ref 6.0–8.5)

## 2019-06-30 LAB — TYPE AND SCREEN
ABO/RH(D): A POS
Antibody Screen: NEGATIVE

## 2019-06-30 LAB — RPR: RPR Ser Ql: NONREACTIVE

## 2019-06-30 LAB — ABO/RH: ABO/RH(D): A POS

## 2019-06-30 LAB — SARS CORONAVIRUS 2 BY RT PCR (HOSPITAL ORDER, PERFORMED IN ~~LOC~~ HOSPITAL LAB): SARS Coronavirus 2: NEGATIVE

## 2019-06-30 LAB — PROTEIN / CREATININE RATIO, URINE
Creatinine, Urine: 90.9 mg/dL
Protein, Ur: 16.5 mg/dL
Protein/Creat Ratio: 182 mg/g creat (ref 0–200)

## 2019-06-30 MED ORDER — ONDANSETRON HCL 4 MG PO TABS: 4.0000 mg | ORAL_TABLET | ORAL | Status: DC | PRN

## 2019-06-30 MED ORDER — COCONUT OIL OIL: 1.0000 "application " | TOPICAL_OIL | Status: DC | PRN

## 2019-06-30 MED ORDER — BENZOCAINE-MENTHOL 20-0.5 % EX AERO
1.0000 "application " | INHALATION_SPRAY | CUTANEOUS | Status: DC | PRN
  Filled 2019-06-30: qty 56

## 2019-06-30 MED ORDER — IBUPROFEN 600 MG PO TABS
600.0000 mg | ORAL_TABLET | Freq: Once | ORAL | Status: AC
  Administered 2019-06-30: 600 mg via ORAL
  Filled 2019-06-30: qty 1

## 2019-06-30 MED ORDER — DIPHENHYDRAMINE HCL 25 MG PO CAPS: 25.0000 mg | ORAL_CAPSULE | Freq: Four times a day (QID) | ORAL | Status: DC | PRN

## 2019-06-30 MED ORDER — LACTATED RINGERS IV SOLN: 500.0000 mL | INTRAVENOUS | Status: DC | PRN

## 2019-06-30 MED ORDER — SOD CITRATE-CITRIC ACID 500-334 MG/5ML PO SOLN: 30.0000 mL | ORAL | Status: DC | PRN

## 2019-06-30 MED ORDER — LIDOCAINE HCL (PF) 1 % IJ SOLN: 30.0000 mL | INTRAMUSCULAR | Status: DC | PRN

## 2019-06-30 MED ORDER — OXYTOCIN 40 UNITS IN NORMAL SALINE INFUSION - SIMPLE MED
2.5000 [IU]/h | INTRAVENOUS | Status: DC
  Administered 2019-06-30: 2.5 [IU]/h via INTRAVENOUS
  Filled 2019-06-30: qty 1000

## 2019-06-30 MED ORDER — OXYTOCIN BOLUS FROM INFUSION
500.0000 mL | Freq: Once | INTRAVENOUS | Status: AC
  Administered 2019-06-30: 500 mL via INTRAVENOUS

## 2019-06-30 MED ORDER — ETONOGESTREL 68 MG ~~LOC~~ IMPL
68.0000 mg | DRUG_IMPLANT | Freq: Once | SUBCUTANEOUS | Status: AC
  Administered 2019-06-30: 68 mg via SUBCUTANEOUS
  Filled 2019-06-30: qty 1

## 2019-06-30 MED ORDER — FLEET ENEMA 7-19 GM/118ML RE ENEM: 1.0000 | ENEMA | RECTAL | Status: DC | PRN

## 2019-06-30 MED ORDER — OXYCODONE-ACETAMINOPHEN 5-325 MG PO TABS: 1.0000 | ORAL_TABLET | ORAL | Status: DC | PRN

## 2019-06-30 MED ORDER — FERROUS SULFATE 325 (65 FE) MG PO TABS
325.0000 mg | ORAL_TABLET | Freq: Two times a day (BID) | ORAL | Status: DC
  Administered 2019-06-30 – 2019-07-01 (×3): 325 mg via ORAL
  Filled 2019-06-30 (×3): qty 1

## 2019-06-30 MED ORDER — DIBUCAINE (PERIANAL) 1 % EX OINT: 1.0000 "application " | TOPICAL_OINTMENT | CUTANEOUS | Status: DC | PRN

## 2019-06-30 MED ORDER — ONDANSETRON HCL 4 MG/2ML IJ SOLN: 4.0000 mg | INTRAMUSCULAR | Status: DC | PRN

## 2019-06-30 MED ORDER — WITCH HAZEL-GLYCERIN EX PADS: 1.0000 "application " | MEDICATED_PAD | CUTANEOUS | Status: DC | PRN

## 2019-06-30 MED ORDER — ONDANSETRON HCL 4 MG/2ML IJ SOLN: 4.0000 mg | Freq: Four times a day (QID) | INTRAMUSCULAR | Status: DC | PRN

## 2019-06-30 MED ORDER — IBUPROFEN 600 MG PO TABS
600.0000 mg | ORAL_TABLET | Freq: Four times a day (QID) | ORAL | Status: DC
  Administered 2019-06-30 – 2019-07-01 (×5): 600 mg via ORAL
  Filled 2019-06-30 (×5): qty 1

## 2019-06-30 MED ORDER — SIMETHICONE 80 MG PO CHEW: 80.0000 mg | CHEWABLE_TABLET | ORAL | Status: DC | PRN

## 2019-06-30 MED ORDER — PRENATAL MULTIVITAMIN CH
1.0000 | ORAL_TABLET | Freq: Every day | ORAL | Status: DC
  Administered 2019-06-30 – 2019-07-01 (×2): 1 via ORAL
  Filled 2019-06-30 (×2): qty 1

## 2019-06-30 MED ORDER — MAGNESIUM HYDROXIDE 400 MG/5ML PO SUSP: 30.0000 mL | ORAL | Status: DC | PRN

## 2019-06-30 MED ORDER — ACETAMINOPHEN 325 MG PO TABS: 650.0000 mg | ORAL_TABLET | ORAL | Status: DC | PRN

## 2019-06-30 MED ORDER — LACTATED RINGERS IV SOLN
INTRAVENOUS | Status: DC
  Administered 2019-06-30: 04:00:00 via INTRAVENOUS

## 2019-06-30 MED ORDER — FENTANYL CITRATE (PF) 100 MCG/2ML IJ SOLN
100.0000 ug | INTRAMUSCULAR | Status: DC | PRN
  Administered 2019-06-30: 100 ug via INTRAVENOUS
  Filled 2019-06-30: qty 2

## 2019-06-30 MED ORDER — OXYCODONE-ACETAMINOPHEN 5-325 MG PO TABS: 2.0000 | ORAL_TABLET | ORAL | Status: DC | PRN

## 2019-06-30 MED ORDER — LIDOCAINE HCL 1 % IJ SOLN
0.0000 mL | Freq: Once | INTRAMUSCULAR | Status: DC | PRN
  Filled 2019-06-30: qty 20

## 2019-06-30 NOTE — Plan of Care (Signed)
Crystal Gould is going well, she is voiding adequately, ambulating well, bleeding WNL and pain is under control.

## 2019-06-30 NOTE — MAU Note (Signed)
Pts that she was in the office today and had her membranes stripped. Pt reports she was 4cm.  Pt states she started having ctx's at 1800 that were irregular, but at 0100 the ctx's started coming every 5 minutes.   Denies LOF. Reports bloody show.  Reports +FM

## 2019-06-30 NOTE — H&P (Signed)
Crystal Gould is a 28 y.o. female presenting for active labor. Patient Crystal Gould around Century during labor evaluation in MAU. Meconium was noted at time of rupture. She denies vaginal bleeding, decreased fetal movement, fever, falls, or recent illness.   Prenatal History - [facility] - Dating by LMP c/w 8 week [place] - Care initiated at 10 weeks - Low risk NIPS - Rubella immune - TDAP 04/19/19 - Flu shot declined  OB History    Gravida  7   Para  4   Term  4   Preterm      AB  2   Living  4     SAB  1   TAB  1   Ectopic      Multiple      Live Births  4          Patient Active Problem List   Diagnosis Date Noted  . Labor and delivery, indication for care 06/30/2019  . BV (bacterial vaginosis) 03/29/2019  . Contraception management 12/28/2018  . Chlamydia 12/11/2018  . Supervision of normal pregnancy 12/07/2018    Past Medical History:  Diagnosis Date  . Medical history non-contributory    Past Surgical History:  Procedure Laterality Date  . NO PAST SURGERIES     Family History: family history includes Cancer in her paternal grandmother; Diabetes in her maternal grandmother; Hypertension in her maternal grandmother. Social History:  reports that she has been smoking cigarettes. She has never used smokeless tobacco. She reports previous alcohol use. She reports that she does not use drugs.     Maternal Diabetes: No Genetic Screening: Normal Maternal Ultrasounds/Referrals: Normal Fetal Ultrasounds or other Referrals:  None Maternal Substance Abuse:  No Significant Maternal Medications:  None Significant Maternal Lab Results:  Group B Strep negative Other Comments:  None  Review of Systems  Respiratory: Negative for shortness of breath.   Gastrointestinal: Positive for abdominal pain.  Musculoskeletal: Negative for back pain.  Neurological: Negative for dizziness, weakness and headaches.  All other systems reviewed and are negative.  Dilation:  5 Effacement (%): 90 Station: -2 Exam by:: Lauren Cox RN  Blood pressure 126/81, pulse (!) 115, temperature 99.8 F (37.7 C), resp. rate 16, last menstrual period 09/28/2018, SpO2 100 %. Physical Exam  Nursing note and vitals reviewed. Constitutional: She is oriented to person, place, and time. She appears well-developed and well-nourished.  Cardiovascular: Normal rate.  Respiratory: Effort normal. No respiratory distress.  GI:  Gravid  Neurological: She is alert and oriented to person, place, and time.  Skin: Skin is warm and dry.  Psychiatric: She has a normal mood and affect. Her behavior is normal. Judgment and thought content normal.    Prenatal labs: ABO, Rh: A/Positive/-- (02/19 1503) Antibody: Negative (06/10 0920) Rubella: 6.16 (02/19 1503) RPR: Non Reactive (06/10 0920)  HBsAg: Negative (02/19 1503)  HIV: Non Reactive (06/10 0920)  GBS: Negative/-- (08/20 1536)   Fetal Surveillance Category I tracing Baseline 145, moderate variability, positive 15 x 15 accels, early and variable decels Toco: irregular ctx q 2-6, palpate moderate  Assessment/Plan: --28 y.o. [id number] at [redacted]w[redacted]d with SOL --Category I tracing in MAU --Grossly ruptured at 0345, meconium --Does not desire epidural  --Elevated BP x 1 in clinic 06/29/2019 --girl/breast/inpt Nexplanon  Darlina Rumpf, CNM 06/30/2019, 4:32 AM

## 2019-06-30 NOTE — Procedures (Addendum)
[facility] INSERTION Patient name: Crystal Gould MRN [id number]  Date of birth: 05/30/1991 Subjective Findings:   Crystal Gould is a 28 y.o. [phone] African American female being seen today for insertion of a Nexplanon.   Risks/benefits/side effects of Nexplanon have been discussed and her questions have been answered.  Specifically, a failure rate of 10/998 has been reported, with an increased failure rate if pt takes Radnor and/or antiseizure medicaitons.  She is aware of the common side effect of irregular bleeding, which the incidence of decreases over time. Signed copy of informed consent in chart.  Pertinent History Reviewed:   Reviewed past medical,surgical, social, obstetrical and family history.  Reviewed problem list, medications and allergies. Objective Findings & Procedure:    Vitals:   06/30/19 0640 06/30/19 0740 06/30/19 1145 06/30/19 1550  BP: 126/77 116/69 106/65 111/63  Pulse: 92 81 81 62  Resp: 18 18 16 17   Temp: 99.4 F (37.4 C) 99.1 F (37.3 C) 98.1 F (36.7 C) 97.9 F (36.6 C)  TempSrc: Oral Oral Oral Oral  SpO2:      Weight:      Height:      Body mass index is 28.72 kg/m.  Results for orders placed or performed during the hospital encounter of 06/30/19 (from the past 24 hour(s))  CBC   Collection Time: 06/30/19  4:04 AM  Result Value Ref Range   WBC 11.7 (H) 4.0 - 10.5 K/uL   RBC 3.83 (L) 3.87 - 5.11 MIL/uL   Hemoglobin 9.2 (L) 12.0 - 15.0 g/dL   HCT 30.2 (L) 36.0 - 46.0 %   MCV 78.9 (L) 80.0 - 100.0 fL   MCH 24.0 (L) 26.0 - 34.0 pg   MCHC 30.5 30.0 - 36.0 g/dL   RDW 16.8 (H) 11.5 - 15.5 %   Platelets 151 150 - 400 K/uL   nRBC 0.0 0.0 - 0.2 %  RPR   Collection Time: 06/30/19  4:04 AM  Result Value Ref Range   RPR Ser Ql NON REACTIVE NON REACTIVE  Type and screen [place]   Collection Time: 06/30/19  4:04 AM  Result Value Ref Range   ABO/RH(D) A POS    Antibody Screen NEG    Sample Expiration     07/03/2019,2359 Performed at [facility], [place] [address], [place], [place] [address]   ABO/Rh   Collection Time: 06/30/19  4:04 AM  Result Value Ref Range   ABO/RH(D)      A POS Performed at [facility] [address] [place], [place] [address]   SARS Coronavirus 2 [facility] order, Performed in [facility] hospital lab) Nasopharyngeal Nasopharyngeal Swab   Collection Time: 06/30/19  4:06 AM   Specimen: Nasopharyngeal Swab  Result Value Ref Range   SARS Coronavirus 2 NEGATIVE NEGATIVE     Time out was performed.  Her left arm, approximately 10cm from the medial epicondyle and 3-5cm posterior to the sulcus, was cleansed with alcohol and anesthetized with 2cc of 2% Lidocaine.  The area was cleansed again with betadine and the Nexplanon was inserted per manufacturer's recommendations without difficulty.  Gauze and pressure bandage were applied. The patient tolerated the procedure well.  Assessment & Plan:   1) Nexplanon insertion Pt was instructed to keep the area clean and dry, remove pressure bandage in 8 hours. Back up contraception was recommended for 2 weeks.  She was given a card indicating date Nexplanon was inserted and date it  needs to be removed. Follow-up PRN problems.  Orders Placed This Encounter  Procedures  . SARS Coronavirus 2 [facility](Hospital order, [facility]) Nasopharyngeal Nasopharyngeal Swab  . CBC  . RPR  . Diet regular Room service appropriate? Yes; Fluid consistency: Thin  . Patient has an active order for admit to inpatient/place in observation  . Vital signs within 30 minutes of admission to unit, Q1hr x1, Q4hr x 3, then every 8 hours until discharge  . Notify physician (specify)  . Activity as tolerated  . Discontinue epidural catheter at conclusion of delivery and/or repair unless otherwise indicated by provider  . Ice packs to perineum or breast   . Straight catheter  . Sitz bath  . Breast pump, breast shells, coconut  oil  . K-pad   . If RhoGAM is indicated order per protocol.  . May draw maternal lab samples for Cord Blood Donation and /or Placenta Recovery Program  . Initiate Oral Care Protocol  . Initiate Carrier Fluid Protocol  . Obtain Consent  . Nexplanon Tray Item: 3 cc Syringe; Item: 25g 1 1/2 inch needle; Item: Alcohol Preps (one for prepping for lidocaine and four for cleaning betadine off of arm prior to pressure bandage being applied); Item: 2 swabs; Item: Steri-strips; Item: 4X4 ga...  . Full code  . Consult to lactation  . Type and screen [facility]  . ABO/Rh  . May convert IV to Saline Lock for antibiotic therapy if tolerating PO fluids well or to maintain IV site (i.e. for surgery in AM)  . May discontinue IV or Saline Lock if postpartum assessment WNL  . Admit to Inpatient (patient's expected length of stay will be greater than 2 midnights or inpatient only procedure)  . Transfer patient    SN# [id number] Exp: 2022 Nov 11 Lot: [id number]  Arabella MerlesKimberly D Tiron Suski CNM 06/30/2019 5:15 PM

## 2019-06-30 NOTE — Discharge Summary (Addendum)
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  Augmentation: N/A  Complications: None  Hospital course:  Onset of Labor With Vaginal Delivery     28y.o. yo [id number] 29w2das admitted in Active Labor on 06/30/2019. Patient had an uncomplicated labor course as follows:  Membrane Rupture Time/Date: 3:46 AM ,06/30/2019   Intrapartum Procedures: Episiotomy: None [1]                                         Lacerations:  None [1]  Patient had a delivery of a Viable infant. 06/30/2019  Information for the patient's newborn:  DaMarsha, Gundlach0[[id number]]     Pateint had an uncomplicated postpartum course.  She is ambulating, tolerating a regular diet, passing flatus, and urinating well. Patient is discharged home in stable condition on 07/01/19.  Delivery time: 4:53 AM    Magnesium Sulfate received: No BMZ received: No Rhophylac:No MMR:No Transfusion:No  Physical exam  Vitals:   06/30/19 1145 06/30/19 1550 06/30/19 1945 07/01/19 0531  BP: 106/65 111/63 110/68 109/80  Pulse: 81 62 64 66  Resp: _0 Temp: 98.1 F (36.7 C) 97.9 F (36.6 C) 98.2 F (36.8 C) 98.1 F (36.7 C)  TempSrc: Oral Oral Oral Oral  SpO2:   99%   Weight:      Height:       General: alert, cooperative and no distress Lochia: appropriate Uterine  Fundus: firm Incision: N/A DVT Evaluation: No evidence of DVT seen on physical exam. No significant calf/ankle edema. Labs: Lab Results  Component Value Date   WBC 11.7 (H) 06/30/2019   HGB 9.2 (L) 06/30/2019   HCT 30.2 (L) 06/30/2019   MCV 78.9 (L) 06/30/2019   PLT 151 06/30/2019   CMP Latest Ref Rng & Units 06/29/2019  Glucose 65 - 99 mg/dL 62(L)  BUN 6 - 20 mg/dL 5(L)  Creatinine 0.57 - 1.00 mg/dL 0.56(L)  Sodium 134 - 144 mmol/L 138  Potassium 3.5 - 5.2 mmol/L 4.3  Chloride 96 - 106 mmol/L 103  CO2 20 - 29 mmol/L 20  Calcium 8.7 - 10.2 mg/dL 9.2  Total Protein 6.0 - 8.5 g/dL 7.2  Total Bilirubin 0.0 - 1.2 mg/dL 0.3  Alkaline Phos 39 - 117 IU/L 227(H)  AST  0 - 40 IU/L 15  ALT 0 - 32 IU/L 7    Discharge instruction: per After Visit Summary and "Baby and Me Booklet".  After visit meds:  Allergies as of 07/01/2019   No Known Allergies     Medication List    STOP taking these medications   Blood Pressure Monitor Automat Devi     TAKE these medications   acetaminophen 325 MG tablet Commonly known as: Tylenol Take 2 tablets (650 mg total) by mouth every 4 (four) hours as needed for up to 14 days (for pain scale < 4).   ibuprofen 600 MG tablet Commonly known as: ADVIL Take 1 tablet (600 mg total) by mouth every 6 (six) hours.   prenatal vitamin w/FE, FA 27-1 MG Tabs tablet Take 1 tablet by mouth daily at 12 noon.       Diet: routine diet  Activity: Advance as tolerated. Pelvic rest for 6 weeks.   Outpatient follow up:4 weeks Follow up Appt: Future Appointments  Date Time Provider [place]  08/04/2019 12:50 PM Roma Schanz, CNM [facility]   Follow up Visit: Follow-up Information    [facility] Follow up.   Why: Needs f/u visit in 4-6weeks for postpartum visit. Contact information: [facility] [place] [address] [phone]           Follow up Visit:   Please schedule this patient for  Postpartum visit in: 4 weeks with the following provider: Any provider Low risk pregnancy complicated by: N/A Delivery mode:  SVD Anticipated Birth Control:  Nexplanon inpatient PP Procedures needed: N/A  Schedule Integrated [place] visit: no  Newborn Data: Live born female  Birth Weight: 6 lb 14.6 oz (3135 g) APGAR: 8, 9  Newborn Delivery   Birth date/time: 06/30/2019 04:53:00 Delivery type: Vaginal, Spontaneous      Baby Feeding: Bottle Disposition:home with mother   07/01/2019 Clarisa Fling, NP    I confirm that I have verified the information documented in the resident's note and that I have also personally reperformed the history, physical exam and all medical decision making activities of this service and have verified that all service and findings are accurately documented in this student's note.   Clarisa Fling, NP 07/01/2019 12:54 PM

## 2019-07-01 MED ORDER — IBUPROFEN 600 MG PO TABS: 600.0000 mg | ORAL_TABLET | Freq: Four times a day (QID) | ORAL | 0 refills | Status: AC

## 2019-07-01 MED ORDER — ACETAMINOPHEN 325 MG PO TABS: 650.0000 mg | ORAL_TABLET | ORAL | 0 refills | Status: AC | PRN

## 2019-07-01 NOTE — Discharge Instructions (Signed)

## 2019-07-31 ENCOUNTER — Encounter: Payer: Self-pay | Admitting: *Deleted

## 2019-08-04 ENCOUNTER — Encounter: Payer: Self-pay | Admitting: Women's Health

## 2019-08-04 ENCOUNTER — Other Ambulatory Visit: Payer: Self-pay

## 2019-08-04 ENCOUNTER — Ambulatory Visit (INDEPENDENT_AMBULATORY_CARE_PROVIDER_SITE_OTHER): Payer: Medicaid Other | Admitting: Women's Health

## 2019-08-04 DIAGNOSIS — Z975 Presence of (intrauterine) contraceptive device: Secondary | ICD-10-CM | POA: Insufficient documentation

## 2019-08-04 NOTE — Progress Notes (Signed)
POSTPARTUM VISIT Patient name: Crystal Gould MRN [id number]  Date of birth: [birth year] Chief Complaint:   Postpartum Care (fell shower last night has cut left side forehead)  History of Present Illness:   Crystal Gould is a 28 y.o. [phone] African American female being seen today for a postpartum visit. She is 5 weeks postpartum following a spontaneous vaginal delivery at 39.2 gestational weeks. Anesthesia: none. Laceration: none. I have fully reviewed the prenatal and intrapartum course. Pregnancy uncomplicated. Postpartum course has been uncomplicated. Bleeding thin lochia. Bowel function is normal. Bladder function is normal. Fell in shower this am, cut forehead. Patient is not sexually active. Last sexual activity: prior to birth of baby.  Contraception method is Nexplanon had placed 06/30/19 in hospital   Last pap 2019 in [place].  Results were normal .  No LMP recorded. Patient has had an implant.  Baby's course has been uncomplicated. Baby is feeding by bottle    Edinburgh Postpartum Depression Screening: negative Edinburgh Postnatal Depression Scale - 08/04/19 1240      Edinburgh Postnatal Depression Scale:  In the Past 7 Days   I have been able to laugh and see the funny side of things.  0    I have looked forward with enjoyment to things.  0    I have blamed myself unnecessarily when things went wrong.  0    I have been anxious or worried for no good reason.  0    I have felt scared or panicky for no good reason.  0    Things have been getting on top of me.  0    I have been so unhappy that I have had difficulty sleeping.  0    I have felt sad or miserable.  0    I have been so unhappy that I have been crying.  0    The thought of harming myself has occurred to me.  0    Edinburgh Postnatal Depression Scale Total  0      Review of Systems:   Pertinent items are noted in HPI Denies Abnormal vaginal discharge w/ itching/odor/irritation, headaches, visual changes,  shortness of breath, chest pain, abdominal pain, severe nausea/vomiting, or problems with urination or bowel movements. Pertinent History Reviewed:  Reviewed past medical,surgical, obstetrical and family history.  Reviewed problem list, medications and allergies. OB History  Gravida Para Term Preterm AB Living  7 5 5   2 5   SAB TAB Ectopic Multiple Live Births  1 1   0 5    # Outcome Date GA Lbr Len/2nd Weight Sex Delivery Anes PTL Lv  7 Term 06/30/19 [redacted]w[redacted]d 02:44 / 00:09 6 lb 14.6 oz (3.135 kg) F Vag-Spont None  LIV  6 SAB 05/2018             Birth Comments: System Generated. Please review and update pregnancy details.  5 Term 09/15/16 [redacted]w[redacted]d  7 lb 15 oz (3.6 kg) F Vag-Spont None N LIV  4 Term 03/05/12 [redacted]w[redacted]d  5 lb 3 oz (2.353 kg) F Vag-Spont None N LIV  3 TAB 2011          2 Term 12/13/07 [redacted]w[redacted]d  7 lb 15 oz (3.6 kg) F Vag-Spont None N LIV  1 Term 12/11/06 [redacted]w[redacted]d  5 lb 5 oz (2.41 kg) F Vag-Spont None N LIV   Physical Assessment:   Vitals:   08/04/19 1237  BP: 129/86  Pulse: 64  Weight: 140 lb 6.4 oz (63.7  kg)  Height: 5\' 2"  (1.575 m)  Body mass index is 25.68 kg/m.       Physical Examination:   General appearance: alert, well appearing, and in no distress  Mental status: alert, oriented to person, place, and time  Skin: warm & dry, ~1.5cm linear clean cut Lt forehead above eye, hemostatic, 3 steri-strips applied to bring edges together  Cardiovascular: normal heart rate noted   Respiratory: normal respiratory effort, no distress   Breasts: deferred, no complaints   Abdomen: soft, non-tender   Pelvic: examination not indicated  Rectal: not examined  Extremities: no edema       No results found for this or any previous visit (from the past 24 hour(s)).  Assessment & Plan:  1) Postpartum exam 2) 5 wks s/p SVB 3) Bottlefeeding 4) Depression screening 5) Nexplanon in place 6) Cut on forehead> steri-strips applied, keep clean and dry  Meds: No orders of the defined types  were placed in this encounter.   Follow-up: Return in about 1 year (around 08/03/2020) for Physical.   No orders of the defined types were placed in this encounter.   [place], [facility] 08/04/2019 1:13 PM

## 2020-11-14 ENCOUNTER — Other Ambulatory Visit: Payer: Medicaid Other

## 2021-01-22 ENCOUNTER — Encounter: Payer: Self-pay | Admitting: Emergency Medicine

## 2021-01-22 ENCOUNTER — Ambulatory Visit
Admission: EM | Admit: 2021-01-22 | Discharge: 2021-01-22 | Disposition: A | Discharge status: Home / Self Care | Payer: Medicaid Other | Attending: Emergency Medicine | Admitting: Emergency Medicine

## 2021-01-22 ENCOUNTER — Other Ambulatory Visit: Payer: Self-pay

## 2021-01-22 DIAGNOSIS — L02414 Cutaneous abscess of left upper limb: Secondary | ICD-10-CM

## 2021-01-22 DIAGNOSIS — S50862A Insect bite (nonvenomous) of left forearm, initial encounter: Secondary | ICD-10-CM | POA: Diagnosis not present

## 2021-01-22 DIAGNOSIS — W57XXXA Bitten or stung by nonvenomous insect and other nonvenomous arthropods, initial encounter: Secondary | ICD-10-CM

## 2021-01-22 MED ORDER — DOXYCYCLINE HYCLATE 100 MG PO TABS
100.0000 mg | ORAL_TABLET | Freq: Once | ORAL | Status: AC
  Administered 2021-01-22: 100 mg via ORAL

## 2021-01-22 MED ORDER — DOXYCYCLINE HYCLATE 100 MG PO CAPS: 100.0000 mg | ORAL_CAPSULE | Freq: Two times a day (BID) | ORAL | 0 refills | Status: AC

## 2021-01-22 NOTE — ED Triage Notes (Signed)
Swelling redness and pain to area on RT forearm x 3 hours.

## 2021-01-22 NOTE — Discharge Instructions (Signed)
Apply warm compresses 3-4x daily for 10-15 minutes Wash site daily with warm water and mild soap Keep covered to avoid friction Take antibiotic as prescribed and to completion Follow up here or with PCP if symptoms persists Return or go to the ED if you have any new or worsening symptoms increased redness, swelling, pain, nausea, vomiting, fever, chills, etc...  

## 2021-01-22 NOTE — ED Provider Notes (Signed)
[facility]   [id number] 01/22/21 Arrival Time: 1452   CC: ABSCESS  SUBJECTIVE:  Crystal Gould is a 30 y.o. female who presents with a possible abscess of her LT forearm x few days. Speculates a spider may have bit her.  Has been trying warm compresses without relief.  Denies fever, chills, nausea, vomiting.  Complains of redness, swelling.    ROS: As per HPI.  All other pertinent ROS negative.     Past Medical History:  Diagnosis Date  . Medical history non-contributory    Past Surgical History:  Procedure Laterality Date  . NO PAST SURGERIES     No Known Allergies No current facility-administered medications on file prior to encounter.   Current Outpatient Medications on File Prior to Encounter  Medication Sig Dispense Refill  . ibuprofen (ADVIL) 600 MG tablet Take 1 tablet (600 mg total) by mouth every 6 (six) hours. 30 tablet 0  . prenatal vitamin w/FE, FA (PRENATAL 1 + 1) 27-1 MG TABS tablet Take 1 tablet by mouth daily at 12 noon. 30 each 12   Social History   Socioeconomic History  . Marital status: Significant Other    Spouse name: Not on file  . Number of children: 5  . Years of education: Not on file  . Highest education level: Not on file  Occupational History  . Occupation: [employer]  Tobacco Use  . Smoking status: Current Some Day Smoker    Types: Cigarettes  . Smokeless tobacco: Never Used  Vaping Use  . Vaping Use: Never used  Substance and Sexual Activity  . Alcohol use: Not Currently    Comment: sometimes  . Drug use: No  . Sexual activity: Not Currently    Birth control/protection: None, Implant  Other Topics Concern  . Not on file  Social History Narrative  . Not on file   Social Determinants of Health   Financial Resource Strain: Not on file  Food Insecurity: Not on file  Transportation Needs: Not on file  Physical Activity: Not on file  Stress: Not on file  Social Connections: Not on file  Intimate Partner Violence: Not on  file   Family History  Problem Relation Age of Onset  . Cancer Paternal Grandmother   . Diabetes Maternal Grandmother   . Hypertension Maternal Grandmother     OBJECTIVE:  Vitals:   01/22/21 1509  BP: 125/86  Pulse: 99  Resp: 17  Temp: 99 F (37.2 C)  TempSrc: Oral  SpO2: 98%     General appearance: alert; no distress CV: radial pulse 2+ Skin: 2-3 cm induration of her LT proximal medial forearm with surrounding erythema; tender to touch; no active drainage Psychological: alert and cooperative; normal mood and affect   ASSESSMENT & PLAN:  1. Insect bite of left forearm, initial encounter   2. Abscess of left forearm     Meds ordered this encounter  Medications  . doxycycline (VIBRAMYCIN) 100 MG capsule    Sig: Take 1 capsule (100 mg total) by mouth 2 (two) times daily.    Dispense:  20 capsule    Refill:  0    Order Specific Question:   Supervising Provider    Answer:   Eustace Moore [[id number]]  . doxycycline (VIBRA-TABS) tablet 100 mg    Apply warm compresses 3-4x daily for 10-15 minutes Wash site daily with warm water and mild soap Keep covered to avoid friction Take antibiotic as prescribed and to completion Follow up here or  with PCP if symptoms persists Return or go to the ED if you have any new or worsening symptoms increased redness, swelling, pain, nausea, vomiting, fever, chills, etc...   Reviewed expectations re: course of current medical issues. Questions answered. Outlined signs and symptoms indicating need for more acute intervention. Patient verbalized understanding. After Visit Summary given.          Rennis Harding, PA-C 01/22/21 1538

## 2023-06-16 ENCOUNTER — Encounter: Payer: Medicaid Other | Admitting: Obstetrics and Gynecology

## 2024-11-16 ENCOUNTER — Encounter: Admitting: Advanced Practice Midwife
